# Patient Record
Sex: Female | Born: 1980 | Race: White | Hispanic: No | Marital: Single | Smoking: Never smoker
Health system: Southern US, Community
[De-identification: ages and names within clinical notes are randomized; demographics above are authoritative.]

## PROBLEM LIST (undated history)

## (undated) DIAGNOSIS — G4700 Insomnia, unspecified: Secondary | ICD-10-CM

## (undated) DIAGNOSIS — R519 Headache, unspecified: Secondary | ICD-10-CM

## (undated) DIAGNOSIS — R51 Headache: Secondary | ICD-10-CM

## (undated) DIAGNOSIS — J302 Other seasonal allergic rhinitis: Secondary | ICD-10-CM

## (undated) DIAGNOSIS — D649 Anemia, unspecified: Secondary | ICD-10-CM

## (undated) DIAGNOSIS — M2240 Chondromalacia patellae, unspecified knee: Secondary | ICD-10-CM

## (undated) HISTORY — DX: Insomnia, unspecified: G47.00

## (undated) HISTORY — DX: Headache: R51

## (undated) HISTORY — PX: BACK SURGERY: SHX140

## (undated) HISTORY — DX: Chondromalacia patellae, unspecified knee: M22.40

## (undated) HISTORY — DX: Headache, unspecified: R51.9

## (undated) HISTORY — DX: Anemia, unspecified: D64.9

---
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
## 2003-03-01 ENCOUNTER — Inpatient Hospital Stay (HOSPITAL_COMMUNITY): Admission: EM | Admit: 2003-03-01 | Discharge: 2003-03-05 | Payer: Self-pay | Admitting: Psychiatry

## 2005-12-03 ENCOUNTER — Other Ambulatory Visit: Admission: RE | Admit: 2005-12-03 | Discharge: 2005-12-03 | Payer: Self-pay | Admitting: Family Medicine

## 2006-02-03 ENCOUNTER — Emergency Department (HOSPITAL_COMMUNITY): Admission: EM | Admit: 2006-02-03 | Discharge: 2006-02-03 | Payer: Self-pay | Admitting: Family Medicine

## 2006-12-05 ENCOUNTER — Other Ambulatory Visit: Admission: RE | Admit: 2006-12-05 | Discharge: 2006-12-05 | Payer: Self-pay | Admitting: Family Medicine

## 2010-10-04 ENCOUNTER — Other Ambulatory Visit: Admission: RE | Admit: 2010-10-04 | Discharge: 2010-10-04 | Payer: Self-pay | Admitting: Family Medicine

## 2011-05-03 NOTE — Discharge Summary (Signed)
NAMEFERNANDA, Mcbride                            ACCOUNT NO.:  [id number]   MEDICAL RECORD NO.:  [id number]                   PATIENT TYPE:  IPS   LOCATION:  0507                                 FACILITY:  [facility]   PHYSICIAN:  Carolanne Grumbling, M.D.                 DATE OF BIRTH:  05-Nov-1981   DATE OF ADMISSION:  03/01/2003  DATE OF DISCHARGE:  03/05/2003                                 DISCHARGE SUMMARY   INTRODUCTION:  The patient was a 30 year old female.   INITIAL ASSESSMENT AND DIAGNOSIS:  The patient was admitted with a history  of depression.  She said things were catching up with her.  She found  herself feeling sad, a bit hopeless and helpless.  She was tearing up and  weepy and started having suicidal ideation.  She had no plan or intent but  she was just scared by the persistent suicidal thoughts.   MENTAL STATUS EXAM:  At the time of the initial evaluation revealed an  alert, tearful, young woman, who was appropriately dressed.  She appeared to  be depressed and affect was flat.  There was no evidence of any thought  disorder or other psychosis.  Short and long-term memory were intact.  Judgment seemed adequate.  Insight was minimal.   PHYSICAL EXAMINATION:  Essentially normal or noncontributory.   ADMISSION DIAGNOSES:   AXIS I:  Depressive disorder not otherwise specified.   AXIS II:  Deferred.   AXIS III:  Migraines by history.   AXIS IV:  Moderate.   AXIS V:  30/70.   LABORATORY DATA:  All indicated laboratory examinations were within normal  limits or noncontributory.   HOSPITAL COURSE:  While in the hospital, patient continued to talk about her  depression.  She basically was overworked and stressed out, she said.  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  She said she found  herself taking on her client's problems and bringing them home with her.  Consequently, even when she was home, she could not relax.  She also had the  pressures of  her school work and, overall, she said she just felt stressed  out.  By the time of discharge, she was denying any suicidal thoughts.  She  did say one other stress was that she considered herself an agnostic and, in  talking to friends, she has tended to lose friends.  She said she did not  want to be deceitful about what she was about and who she was but, at the  same time, she depended on her friends and did not know what to do with  that.  She also struggle with her mother to some extent.  Her father had  always been absent.  She was trying to get to know him.  He lives in  [place].  She said she could put that off  for a while because that was,  again, adding another stress to her life.  Because she consistently denied  any suicidal thoughts or suicidal intent, she was discharged home.   FINAL DIAGNOSES:   AXIS I:  Depressive disorder not otherwise specified.   AXIS II:  No diagnosis.   AXIS III:  Migraines by history.   AXIS IV:  Moderate.   AXIS V:  55/70.   DISCHARGE MEDICATIONS:  1. Adderall XR 10 mg daily.  2. Lexapro 10 mg daily.  3. Ambien 5 mg at bedtime as needed for sleep.   ACTIVITY/DIET:  There were no restrictions placed on her activity or her  diet.   FOLLOW UP:  She was to follow up at the [facility]  with an appointment for the 24th of March.                                               Carolanne Grumbling, M.D.    GT/MEDQ  D:  03/18/2003  T:  03/18/2003  Job:  [id number]

## 2011-05-03 NOTE — H&P (Signed)
[name], [name]                            ACCOUNT NO.:  [id number]   MEDICAL RECORD NO.:  [id number]                   PATIENT TYPE:  IPS   LOCATION:  0507                                 [facility]ACILITY[facility]  [facility]   PHYSICIAN:  Carolanne Grumbling, M.D.                 DATE OF BIRTH:  1981-09-26   DATE OF ADMISSION:  03/01/2003  DATE OF DISCHARGE:  03/05/2003                         PSYCHIATRIC ADMISSION ASSESSMENT   IDENTIFYING INFORMATION:  This is a 30 year old single white female  voluntarily admitted on March 01, 2003.   HISTORY OF PRESENT ILLNESS:  The patient presents with a history of  depression.  She states that things are just catching up with her and  found herself tearing up, feeling very weepy, having some suicidal  thoughts with no specific plans.  The patient is having difficulty feeling.  She is worrying about things such as her religious beliefs with other  relationships that she has.  She states that she is agnostic.  She reports  decreased sleep.  She states her appetite has been increasing.  She denies  any psychotic symptoms and promises safety on the unit.   PAST PSYCHIATRIC HISTORY:  First admission to [facility].  No  other psychiatric admissions.  No outpatient treatment.  The patient states  she did overdose at the age of 29 but was not hospitalized.   SOCIAL HISTORY:  This is a 30 year old single white female with no children.  She lives in a dorm at [employer].  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  Reports no legal problems.   FAMILY HISTORY:  The patient admits that there may be, but it is uncertain  as to diagnosis.   ALCOHOL/DRUG HISTORY:  She is a nonsmoker.  Drinks on occasion.  Feels it is  not a problem.  No drug use.   PRIMARY CARE PHYSICIAN[facility].   MEDICAL PROBLEMS:  Migraines, seasonal allergies, acid reflux, back  problems.   MEDICATIONS:  The patient takes over-the-counter  Motrin.   ALLERGIES:  No known allergies.   REVIEW OF SYSTEMS:  History of migraines, some muscle cramping and  environmental allergies and some chronic back pain.   PHYSICAL EXAMINATION:  VITAL SIGNS:  Temperature 99.2, heart rate 92,  respirations 28, blood pressure 128/76.  The patient is 5 feet 7 inches  tall.  She is 206 pounds.  She is a healthy-appearing but obese 30 year old  female in no acute distress.  Her respirations are easy.  Her skin color is  good.  Muscle strength and tone is equal bilaterally.  No neurological  findings on nonfocal.   LABORATORY DATA:  CBC is within normal limits.  CMET is within normal  limits.  Her urine drug screen is negative.  Her TSH is 1.097.   MENTAL STATUS EXAM:  She is an alert, young female.  Cooperative.  Casually  dressed.  Speech is clear and articulate.  Mood is depressed.  Affect is  flat.  Thought processes are coherent with no evidence of psychosis.  Does  not appear to be responding to any internal stimuli.  Cognitive function is  intact.  Memory is fair.  Judgment and insight are fair.  Appears to have  average to above-average intellect.   DIAGNOSES:   AXIS I:  Depression not otherwise specified.   AXIS II:  Deferred.   AXIS III:  1. Migraines.  2. Seasonal allergies.  3. Acid reflux.  4. Chronic back pain.   AXIS IV:  Other psychosocial problems.   AXIS V:  Current 30; this past year 65-70.   PLAN:  Voluntary admission for depression and suicidal ideation.  Contract  for safety.  Check every 15 minutes.  Will initiate an antidepressant to  decrease depressive symptoms.  The patient was agreeable to beginning  medication.  Medication-compliance was discussed to stabilize mood and  thinking so patient can be safe.  The patient is to follow up at [facility] with  health services and counseling services.   TENTATIVE LENGTH OF STAY:  Three to five days.      [name], N.P.                       Carolanne Grumbling,  M.D.    JO/MEDQ  D:  04/11/2003  T:  04/12/2003  Job:  [id number]

## 2011-05-20 ENCOUNTER — Other Ambulatory Visit: Payer: Self-pay | Admitting: Physician Assistant

## 2011-05-20 ENCOUNTER — Other Ambulatory Visit (HOSPITAL_COMMUNITY)
Admission: RE | Admit: 2011-05-20 | Discharge: 2011-05-20 | Disposition: A | Discharge status: Home / Self Care | Payer: 59 | Source: Ambulatory Visit | Attending: Family Medicine | Admitting: Family Medicine

## 2011-05-20 DIAGNOSIS — R87612 Low grade squamous intraepithelial lesion on cytologic smear of cervix (LGSIL): Secondary | ICD-10-CM | POA: Insufficient documentation

## 2011-05-21 ENCOUNTER — Encounter (HOSPITAL_BASED_OUTPATIENT_CLINIC_OR_DEPARTMENT_OTHER)
Admission: RE | Admit: 2011-05-21 | Discharge: 2011-05-21 | Disposition: A | Discharge status: Home / Self Care | Payer: 59 | Source: Ambulatory Visit | Attending: General Surgery | Admitting: General Surgery

## 2011-05-21 LAB — DIFFERENTIAL
Basophils Absolute: 0.1 10*3/uL (ref 0.0–0.1)
Lymphocytes Relative: 49 % — ABNORMAL HIGH (ref 12–46)
Lymphs Abs: 3.2 10*3/uL (ref 0.7–4.0)
Monocytes Absolute: 0.5 10*3/uL (ref 0.1–1.0)
Neutro Abs: 2.6 10*3/uL (ref 1.7–7.7)

## 2011-05-21 LAB — CBC
HCT: 35.7 % — ABNORMAL LOW (ref 36.0–46.0)
Hemoglobin: 11.8 g/dL — ABNORMAL LOW (ref 12.0–15.0)
MCHC: 33.1 g/dL (ref 30.0–36.0)
MCV: 85.6 fL (ref 78.0–100.0)
WBC: 6.6 10*3/uL (ref 4.0–10.5)

## 2011-05-23 ENCOUNTER — Ambulatory Visit (HOSPITAL_BASED_OUTPATIENT_CLINIC_OR_DEPARTMENT_OTHER)
Admission: RE | Admit: 2011-05-23 | Discharge: 2011-05-23 | Disposition: A | Discharge status: Home / Self Care | Payer: 59 | Source: Ambulatory Visit | Attending: General Surgery | Admitting: General Surgery

## 2011-05-23 ENCOUNTER — Other Ambulatory Visit: Payer: Self-pay | Admitting: General Surgery

## 2011-05-23 DIAGNOSIS — L7212 Trichodermal cyst: Secondary | ICD-10-CM | POA: Insufficient documentation

## 2011-05-23 DIAGNOSIS — K219 Gastro-esophageal reflux disease without esophagitis: Secondary | ICD-10-CM | POA: Insufficient documentation

## 2011-05-23 HISTORY — PX: CYSTECTOMY: SUR359

## 2011-05-30 ENCOUNTER — Encounter (INDEPENDENT_AMBULATORY_CARE_PROVIDER_SITE_OTHER): Payer: Self-pay | Admitting: General Surgery

## 2011-06-20 NOTE — Op Note (Signed)
  [name], [name]                ACCOUNT NO.:  [id number]  MEDICAL RECORD NO.:  [id number]  LOCATION:                                 FACILITY:  PHYSICIAN:  Mary Sella. Andrey Campanile, MD          DATE OF BIRTH:  DATE OF PROCEDURE:  05/23/2011 DATE OF DISCHARGE:                              OPERATIVE REPORT   PREOPERATIVE DIAGNOSIS:  Scalp pilar cyst x3.  POSTOPERATIVE DIAGNOSIS:  Scalp pilar cyst x3.  PROCEDURE:  Excision of scalp pilar cyst x3.  SURGEON:  Mary Sella. Andrey Campanile, MD  ASSISTANT SURGEON:  None.  ANESTHESIA:  Monitored anesthesia care plus 9 mL of 0.25% Marcaine with epi mixed with bicarb.  SPECIMEN:  Pilar cyst x3.  FINDINGS:  There are three approximately 1-cm epidermoid inclusion cyst on the scalp consistent with a pilar cyst mainly on the right side of the scalp.  ESTIMATED BLOOD LOSS:  Minimal.  INDICATIONS FOR PROCEDURE:  The patient is a very pleasant 30 year old Caucasian female who referred herself for several subcutaneous masses on her scalp.  After physical exam, they were consistent with pilar cyst. She had three that were particularly bothering her.  She desired surgical excision.  We discussed the risks and benefits of surgery including but not limited to bleeding, infection, possible recurrence, wound healing complications, and scarring.  She elected to proceed to surgery.  DESCRIPTION OF PROCEDURE:  After obtaining informed consent, the patient was brought back to the operating room, placed supine on the operating table.  Monitored anesthesia care was established.  She had been placed on the beanbag with the appropriate position and padding, and she was then placed in the left lateral decubitus position with her right side up.  She received Ancef prior to skin incision.  A surgical time-out was performed.  We used diluted ChloraPrep to prep the three areas on her scalp.  One was on the right, one was the top of her scalp, and one was more anterior on  the top of her crown.  They essentially were in the triangle.  The hair was sort of matted down and away.  I infiltrated local in the scalp in a regional fashion and then made a 1-cm overlying incision and then used a hemostat to circumferentially excise the cyst in its entirety.  The wound was irrigated.  I then placed two interrupted 4-0 Prolene sutures to reapproximate the skin.  The same procedure was done for the two other lesions.  A total of 9 mL of local was used.  The patient tolerated the procedure well.  There were no immediate complications. She was taken to the recovery room in stable condition.  There were no immediate complications.     Mary Sella. Andrey Campanile, MD     EMW/MEDQ  D:  05/23/2011  T:  05/23/2011  Job:  [id number]  Electronically Signed by Gaynelle Adu M.D. on 06/20/2011 02:00:56 PM

## 2012-08-03 ENCOUNTER — Other Ambulatory Visit: Payer: Self-pay | Admitting: Physician Assistant

## 2012-08-03 ENCOUNTER — Other Ambulatory Visit (HOSPITAL_COMMUNITY)
Admission: RE | Admit: 2012-08-03 | Discharge: 2012-08-03 | Disposition: A | Discharge status: Home / Self Care | Payer: 59 | Source: Ambulatory Visit | Attending: Family Medicine | Admitting: Family Medicine

## 2012-08-03 DIAGNOSIS — Z124 Encounter for screening for malignant neoplasm of cervix: Secondary | ICD-10-CM | POA: Insufficient documentation

## 2012-10-21 ENCOUNTER — Encounter (HOSPITAL_COMMUNITY): Payer: Self-pay | Admitting: Pharmacy Technician

## 2012-10-21 ENCOUNTER — Other Ambulatory Visit: Payer: Self-pay | Admitting: Neurological Surgery

## 2012-10-22 NOTE — Progress Notes (Signed)
Nurse called Dr. Verlee Rossetti office and informed staff that orders were under the signed and held tab and needed to be signed by Dr. Danielle Dess. Darl Pikes stated she would tell Dr. Danielle Dess.

## 2012-10-22 NOTE — Pre-Procedure Instructions (Signed)
20 [name]  10/22/2012   Your procedure is scheduled on:  Monday October 26, 2012.   Report to [facility] at 1215 PM.  Call this number if you have problems the morning of surgery: [phone]   Remember:   Do not eat food or drink:After Midnight.    Take these medicines the morning of surgery with A SIP OF WATER: Hydrocodone if needed for pain   Do not wear jewelry, make-up or nail polish.  Do not wear lotions, powders, or perfumes.   Do not shave 48 hours prior to surgery.   Do not bring valuables to the hospital.  Contacts, dentures or bridgework may not be worn into surgery.  Leave suitcase in the car. After surgery it may be brought to your room.  For patients admitted to the hospital, checkout time is 11:00 AM the day of discharge.   Patients discharged the day of surgery will not be allowed to drive home.  Name and phone number of your driver:   Special Instructions: Shower using CHG 2 nights before surgery and the night before surgery.  If you shower the day of surgery use CHG.  Use special wash - you have one bottle of CHG for all showers.  You should use approximately 1/3 of the bottle for each shower.   Please read over the following fact sheets that you were given: Pain Booklet, Coughing and Deep Breathing, MRSA Information and Surgical Site Infection Prevention

## 2012-10-23 ENCOUNTER — Encounter (HOSPITAL_COMMUNITY): Payer: Self-pay

## 2012-10-23 ENCOUNTER — Encounter (HOSPITAL_COMMUNITY)
Admission: RE | Admit: 2012-10-23 | Discharge: 2012-10-23 | Disposition: A | Discharge status: Home / Self Care | Payer: 59 | Source: Ambulatory Visit | Attending: Neurological Surgery | Admitting: Neurological Surgery

## 2012-10-23 HISTORY — DX: Other seasonal allergic rhinitis: J30.2

## 2012-10-23 LAB — CBC
Hemoglobin: 13.7 g/dL (ref 12.0–15.0)
MCH: 29.5 pg (ref 26.0–34.0)
MCHC: 34 g/dL (ref 30.0–36.0)
MCV: 86.9 fL (ref 78.0–100.0)

## 2012-10-23 LAB — SURGICAL PCR SCREEN: MRSA, PCR: NEGATIVE

## 2012-10-23 LAB — HCG, SERUM, QUALITATIVE: Preg, Serum: NEGATIVE

## 2012-10-23 NOTE — Progress Notes (Signed)
Patient denied having a cardiac or pulmonary history.

## 2012-10-25 MED ORDER — CEFAZOLIN SODIUM-DEXTROSE 2-3 GM-% IV SOLR
2.0000 g | INTRAVENOUS | Status: AC
  Administered 2012-10-26: 2 g via INTRAVENOUS
  Filled 2012-10-25: qty 50

## 2012-10-26 ENCOUNTER — Encounter (HOSPITAL_COMMUNITY): Payer: Self-pay | Admitting: *Deleted

## 2012-10-26 ENCOUNTER — Encounter (HOSPITAL_COMMUNITY): Payer: Self-pay | Admitting: Certified Registered"

## 2012-10-26 ENCOUNTER — Ambulatory Visit (HOSPITAL_COMMUNITY)
Admission: RE | Admit: 2012-10-26 | Discharge: 2012-10-27 | Disposition: A | Discharge status: Home / Self Care | Payer: 59 | Source: Ambulatory Visit | Attending: Neurological Surgery | Admitting: Neurological Surgery

## 2012-10-26 ENCOUNTER — Ambulatory Visit (HOSPITAL_COMMUNITY): Payer: 59

## 2012-10-26 ENCOUNTER — Encounter (HOSPITAL_COMMUNITY)
Admission: RE | Disposition: A | Discharge status: Home / Self Care | Payer: Self-pay | Source: Ambulatory Visit | Attending: Neurological Surgery

## 2012-10-26 ENCOUNTER — Ambulatory Visit (HOSPITAL_COMMUNITY): Payer: 59 | Admitting: Certified Registered"

## 2012-10-26 DIAGNOSIS — Z01812 Encounter for preprocedural laboratory examination: Secondary | ICD-10-CM | POA: Insufficient documentation

## 2012-10-26 DIAGNOSIS — M5127 Other intervertebral disc displacement, lumbosacral region: Secondary | ICD-10-CM

## 2012-10-26 DIAGNOSIS — M5126 Other intervertebral disc displacement, lumbar region: Secondary | ICD-10-CM | POA: Insufficient documentation

## 2012-10-26 HISTORY — PX: LUMBAR LAMINECTOMY/DECOMPRESSION MICRODISCECTOMY: SHX5026

## 2012-10-26 SURGERY — LUMBAR LAMINECTOMY/DECOMPRESSION MICRODISCECTOMY 1 LEVEL
Anesthesia: General | Site: Back | Laterality: Right | Wound class: Clean

## 2012-10-26 MED ORDER — QUETIAPINE FUMARATE 50 MG PO TABS
50.0000 mg | ORAL_TABLET | Freq: Every day | ORAL | Status: DC
  Administered 2012-10-26: 50 mg via ORAL
  Filled 2012-10-26 (×2): qty 1

## 2012-10-26 MED ORDER — PROPOFOL 10 MG/ML IV BOLUS
INTRAVENOUS | Status: DC | PRN
  Administered 2012-10-26: 200 mg via INTRAVENOUS

## 2012-10-26 MED ORDER — MIDAZOLAM HCL 5 MG/5ML IJ SOLN
INTRAMUSCULAR | Status: DC | PRN
  Administered 2012-10-26: 2 mg via INTRAVENOUS

## 2012-10-26 MED ORDER — HEMOSTATIC AGENTS (NO CHARGE) OPTIME
TOPICAL | Status: DC | PRN
  Administered 2012-10-26: 1 via TOPICAL

## 2012-10-26 MED ORDER — PROMETHAZINE HCL 25 MG/ML IJ SOLN: 6.2500 mg | INTRAMUSCULAR | Status: DC | PRN

## 2012-10-26 MED ORDER — LIDOCAINE HCL (CARDIAC) 20 MG/ML IV SOLN
INTRAVENOUS | Status: DC | PRN
  Administered 2012-10-26: 60 mg via INTRAVENOUS

## 2012-10-26 MED ORDER — HYDROMORPHONE HCL PF 1 MG/ML IJ SOLN
INTRAMUSCULAR | Status: AC
  Filled 2012-10-26: qty 1

## 2012-10-26 MED ORDER — 0.9 % SODIUM CHLORIDE (POUR BTL) OPTIME
TOPICAL | Status: DC | PRN
  Administered 2012-10-26: 1000 mL

## 2012-10-26 MED ORDER — LACTATED RINGERS IV SOLN
INTRAVENOUS | Status: DC | PRN
  Administered 2012-10-26 (×2): via INTRAVENOUS

## 2012-10-26 MED ORDER — MORPHINE SULFATE 2 MG/ML IJ SOLN
1.0000 mg | INTRAMUSCULAR | Status: DC | PRN
  Administered 2012-10-27: 2 mg via INTRAVENOUS
  Filled 2012-10-26: qty 1

## 2012-10-26 MED ORDER — ACETAMINOPHEN 650 MG RE SUPP: 650.0000 mg | RECTAL | Status: DC | PRN

## 2012-10-26 MED ORDER — ACETAMINOPHEN 325 MG PO TABS: 650.0000 mg | ORAL_TABLET | ORAL | Status: DC | PRN

## 2012-10-26 MED ORDER — HYDROCODONE-ACETAMINOPHEN 5-325 MG PO TABS: 1.0000 | ORAL_TABLET | ORAL | Status: DC | PRN

## 2012-10-26 MED ORDER — OXYCODONE HCL 5 MG PO TABS: 5.0000 mg | ORAL_TABLET | Freq: Once | ORAL | Status: DC | PRN

## 2012-10-26 MED ORDER — MENTHOL 3 MG MT LOZG: 1.0000 | LOZENGE | OROMUCOSAL | Status: DC | PRN

## 2012-10-26 MED ORDER — DIAZEPAM 5 MG PO TABS: 5.0000 mg | ORAL_TABLET | Freq: Four times a day (QID) | ORAL | Status: DC | PRN

## 2012-10-26 MED ORDER — LIDOCAINE-EPINEPHRINE 1 %-1:100000 IJ SOLN
INTRAMUSCULAR | Status: DC | PRN
  Administered 2012-10-26: 10 mL

## 2012-10-26 MED ORDER — VECURONIUM BROMIDE 10 MG IV SOLR
INTRAVENOUS | Status: DC | PRN
  Administered 2012-10-26: 8 mg via INTRAVENOUS

## 2012-10-26 MED ORDER — SODIUM CHLORIDE 0.9 % IJ SOLN: 3.0000 mL | INTRAMUSCULAR | Status: DC | PRN

## 2012-10-26 MED ORDER — MEPERIDINE HCL 25 MG/ML IJ SOLN: 6.2500 mg | INTRAMUSCULAR | Status: DC | PRN

## 2012-10-26 MED ORDER — OXYCODONE-ACETAMINOPHEN 5-325 MG PO TABS
1.0000 | ORAL_TABLET | ORAL | Status: DC | PRN
  Administered 2012-10-27: 2 via ORAL
  Filled 2012-10-26: qty 2

## 2012-10-26 MED ORDER — OXYCODONE HCL 5 MG/5ML PO SOLN
5.0000 mg | Freq: Once | ORAL | Status: DC | PRN
Start: 2012-10-26 — End: 2012-10-26

## 2012-10-26 MED ORDER — SODIUM CHLORIDE 0.9 % IJ SOLN: 3.0000 mL | Freq: Two times a day (BID) | INTRAMUSCULAR | Status: DC

## 2012-10-26 MED ORDER — THROMBIN 5000 UNITS EX SOLR
CUTANEOUS | Status: DC | PRN
  Administered 2012-10-26 (×2): 5000 [IU] via TOPICAL

## 2012-10-26 MED ORDER — BACITRACIN 50000 UNITS IM SOLR
INTRAMUSCULAR | Status: DC | PRN
  Administered 2012-10-26: 17:00:00

## 2012-10-26 MED ORDER — BUPIVACAINE HCL (PF) 0.5 % IJ SOLN
INTRAMUSCULAR | Status: DC | PRN
  Administered 2012-10-26 (×2): 10 mL

## 2012-10-26 MED ORDER — CEFAZOLIN SODIUM 1-5 GM-% IV SOLN
1.0000 g | Freq: Three times a day (TID) | INTRAVENOUS | Status: AC
  Administered 2012-10-27 (×2): 1 g via INTRAVENOUS
  Filled 2012-10-26 (×2): qty 50

## 2012-10-26 MED ORDER — KETOROLAC TROMETHAMINE 30 MG/ML IJ SOLN
INTRAMUSCULAR | Status: AC
  Administered 2012-10-26: 30 mg
  Filled 2012-10-26: qty 1

## 2012-10-26 MED ORDER — SODIUM CHLORIDE 0.9 % IV SOLN
INTRAVENOUS | Status: AC
  Filled 2012-10-26: qty 500

## 2012-10-26 MED ORDER — ONDANSETRON HCL 4 MG/2ML IJ SOLN
INTRAMUSCULAR | Status: AC
  Filled 2012-10-26: qty 2

## 2012-10-26 MED ORDER — KETOROLAC TROMETHAMINE 30 MG/ML IJ SOLN
30.0000 mg | Freq: Four times a day (QID) | INTRAMUSCULAR | Status: DC
  Administered 2012-10-27: 30 mg via INTRAVENOUS
  Filled 2012-10-26 (×4): qty 1

## 2012-10-26 MED ORDER — BACITRACIN 50000 UNITS IM SOLR
INTRAMUSCULAR | Status: AC
  Filled 2012-10-26: qty 1

## 2012-10-26 MED ORDER — SODIUM CHLORIDE 0.9 % IV SOLN
250.0000 mL | INTRAVENOUS | Status: DC
  Administered 2012-10-27: 250 mL via INTRAVENOUS

## 2012-10-26 MED ORDER — HYDROMORPHONE HCL PF 1 MG/ML IJ SOLN
0.2500 mg | INTRAMUSCULAR | Status: DC | PRN
  Administered 2012-10-26 (×4): 0.5 mg via INTRAVENOUS

## 2012-10-26 MED ORDER — SUFENTANIL CITRATE 50 MCG/ML IV SOLN
INTRAVENOUS | Status: DC | PRN
  Administered 2012-10-26: 10 ug via INTRAVENOUS
  Administered 2012-10-26: 20 ug via INTRAVENOUS
  Administered 2012-10-26: 10 ug via INTRAVENOUS

## 2012-10-26 MED ORDER — PHENOL 1.4 % MT LIQD: 1.0000 | OROMUCOSAL | Status: DC | PRN

## 2012-10-26 MED ORDER — ONDANSETRON HCL 4 MG/2ML IJ SOLN
4.0000 mg | INTRAMUSCULAR | Status: DC | PRN
  Administered 2012-10-26: 4 mg via INTRAVENOUS

## 2012-10-26 SURGICAL SUPPLY — 54 items
ADH SKN CLS APL DERMABOND .7 (GAUZE/BANDAGES/DRESSINGS) ×1
BAG DECANTER FOR FLEXI CONT (MISCELLANEOUS) ×2 IMPLANT
BLADE SURG ROTATE 9660 (MISCELLANEOUS) IMPLANT
BUR ACORN 6.0 (BURR) ×1 IMPLANT
BUR MATCHSTICK NEURO 3.0 LAGG (BURR) ×2 IMPLANT
CANISTER SUCTION 2500CC (MISCELLANEOUS) ×2 IMPLANT
CLOTH BEACON ORANGE TIMEOUT ST (SAFETY) ×2 IMPLANT
CONT SPEC 4OZ CLIKSEAL STRL BL (MISCELLANEOUS) ×2 IMPLANT
DECANTER SPIKE VIAL GLASS SM (MISCELLANEOUS) ×2 IMPLANT
DERMABOND ADVANCED (GAUZE/BANDAGES/DRESSINGS) ×1
DERMABOND ADVANCED .7 DNX12 (GAUZE/BANDAGES/DRESSINGS) ×1 IMPLANT
DRAPE MICROSCOPE LEICA (MISCELLANEOUS) ×1 IMPLANT
DRAPE PED LAPAROTOMY (DRAPES) ×2 IMPLANT
DRAPE POUCH INSTRU U-SHP 10X18 (DRAPES) ×2 IMPLANT
DRAPE PROXIMA HALF (DRAPES) ×1 IMPLANT
DURAPREP 26ML APPLICATOR (WOUND CARE) ×2 IMPLANT
ELECT REM PT RETURN 9FT ADLT (ELECTROSURGICAL) ×2
ELECTRODE REM PT RTRN 9FT ADLT (ELECTROSURGICAL) ×1 IMPLANT
GAUZE SPONGE 4X4 16PLY XRAY LF (GAUZE/BANDAGES/DRESSINGS) IMPLANT
GLOVE BIOGEL PI IND STRL 7.5 (GLOVE) IMPLANT
GLOVE BIOGEL PI IND STRL 8 (GLOVE) IMPLANT
GLOVE BIOGEL PI IND STRL 8.5 (GLOVE) ×1 IMPLANT
GLOVE BIOGEL PI INDICATOR 7.5 (GLOVE) ×1
GLOVE BIOGEL PI INDICATOR 8 (GLOVE) ×2
GLOVE BIOGEL PI INDICATOR 8.5 (GLOVE) ×1
GLOVE ECLIPSE 7.5 STRL STRAW (GLOVE) ×4 IMPLANT
GLOVE ECLIPSE 8.5 STRL (GLOVE) ×2 IMPLANT
GLOVE EXAM NITRILE LRG STRL (GLOVE) IMPLANT
GLOVE EXAM NITRILE MD LF STRL (GLOVE) IMPLANT
GLOVE EXAM NITRILE XL STR (GLOVE) IMPLANT
GLOVE EXAM NITRILE XS STR PU (GLOVE) IMPLANT
GOWN BRE IMP SLV AUR LG STRL (GOWN DISPOSABLE) ×1 IMPLANT
GOWN BRE IMP SLV AUR XL STRL (GOWN DISPOSABLE) ×1 IMPLANT
GOWN STRL REIN 2XL LVL4 (GOWN DISPOSABLE) ×3 IMPLANT
KIT BASIN OR (CUSTOM PROCEDURE TRAY) ×2 IMPLANT
KIT ROOM TURNOVER OR (KITS) ×2 IMPLANT
NDL SPNL 20GX3.5 QUINCKE YW (NEEDLE) IMPLANT
NEEDLE HYPO 22GX1.5 SAFETY (NEEDLE) ×2 IMPLANT
NEEDLE SPNL 20GX3.5 QUINCKE YW (NEEDLE) ×2 IMPLANT
NS IRRIG 1000ML POUR BTL (IV SOLUTION) ×2 IMPLANT
PACK LAMINECTOMY NEURO (CUSTOM PROCEDURE TRAY) ×2 IMPLANT
PAD ARMBOARD 7.5X6 YLW CONV (MISCELLANEOUS) ×6 IMPLANT
PATTIES SURGICAL .5 X1 (DISPOSABLE) ×1 IMPLANT
RUBBERBAND STERILE (MISCELLANEOUS) ×2 IMPLANT
SPONGE GAUZE 4X4 12PLY (GAUZE/BANDAGES/DRESSINGS) ×2 IMPLANT
SPONGE SURGIFOAM ABS GEL SZ50 (HEMOSTASIS) ×3 IMPLANT
SUT VIC AB 1 CT1 18XBRD ANBCTR (SUTURE) ×1 IMPLANT
SUT VIC AB 1 CT1 8-18 (SUTURE) ×2
SUT VIC AB 2-0 CP2 18 (SUTURE) ×2 IMPLANT
SUT VIC AB 3-0 SH 8-18 (SUTURE) ×2 IMPLANT
SYR 20ML ECCENTRIC (SYRINGE) ×2 IMPLANT
TOWEL OR 17X24 6PK STRL BLUE (TOWEL DISPOSABLE) ×2 IMPLANT
TOWEL OR 17X26 10 PK STRL BLUE (TOWEL DISPOSABLE) ×2 IMPLANT
WATER STERILE IRR 1000ML POUR (IV SOLUTION) ×2 IMPLANT

## 2012-10-26 NOTE — Anesthesia Preprocedure Evaluation (Addendum)
Anesthesia Evaluation  Patient identified by MRN, date of birth, ID band Patient awake    Reviewed: Allergy & Precautions, H&P , NPO status , Patient's Chart, lab work & pertinent test results  History of Anesthesia Complications Negative for: history of anesthetic complications  Airway Mallampati: I  Neck ROM: full    Dental No notable dental hx. (+) Teeth Intact and Dental Advidsory Given   Pulmonary neg pulmonary ROS,  breath sounds clear to auscultation  Pulmonary exam normal       Cardiovascular negative cardio ROS  IRhythm:regular Rate:Normal     Neuro/Psych  Headaches, negative neurological ROS  negative psych ROS   GI/Hepatic negative GI ROS, Neg liver ROS,   Endo/Other  negative endocrine ROS  Renal/GU negative Renal ROS  negative genitourinary   Musculoskeletal   Abdominal   Peds  Hematology negative hematology ROS (+)   Anesthesia Other Findings   Reproductive/Obstetrics negative OB ROS                           Anesthesia Physical Anesthesia Plan  ASA: I  Anesthesia Plan: General and General ETT   Post-op Pain Management:    Induction:   Airway Management Planned:   Additional Equipment:   Intra-op Plan:   Post-operative Plan:   Informed Consent: I have reviewed the patients History and Physical, chart, labs and discussed the procedure including the risks, benefits and alternatives for the proposed anesthesia with the patient or authorized representative who has indicated his/her understanding and acceptance.   Dental advisory given  Plan Discussed with: CRNA, Surgeon and Anesthesiologist  Anesthesia Plan Comments:        Anesthesia Quick Evaluation

## 2012-10-26 NOTE — Anesthesia Postprocedure Evaluation (Signed)
Anesthesia Post Note  Patient: Denisia Edgeman  Procedure(s) Performed: Procedure(s) (LRB): LUMBAR LAMINECTOMY/DECOMPRESSION MICRODISCECTOMY 1 LEVEL (Right)  Anesthesia type: general  Patient location: PACU  Post pain: Pain level controlled  Post assessment: Patient's Cardiovascular Status Stable  Last Vitals:  Filed Vitals:   10/26/12 1845  BP: 133/77  Pulse: 92  Temp:   Resp: 18    Post vital signs: Reviewed and stable  Level of consciousness: sedated  Complications: No apparent anesthesia complications

## 2012-10-26 NOTE — Preoperative (Signed)
Beta Blockers   Reason not to administer Beta Blockers:Not Applicable 

## 2012-10-26 NOTE — Transfer of Care (Signed)
Immediate Anesthesia Transfer of Care Note  Patient: [name]  Procedure(s) Performed: Procedure(s) (LRB) with comments: LUMBAR LAMINECTOMY/DECOMPRESSION MICRODISCECTOMY 1 LEVEL (Right) - Right Lumbar Five-Sacral One Microdiskectomy  Patient Location: PACU  Anesthesia Type:General  Level of Consciousness: awake, alert  and oriented  Airway & Oxygen Therapy: Patient Spontanous Breathing and Patient connected to nasal cannula oxygen  Post-op Assessment: Report given to PACU RN, Post -op Vital signs reviewed and stable and Patient moving all extremities  Post vital signs: Reviewed and stable  Complications: No apparent anesthesia complications

## 2012-10-26 NOTE — Progress Notes (Signed)
Dr. Danielle Dess visited at bedside

## 2012-10-26 NOTE — H&P (Signed)
CHIEF COMPLAINT:   Back and right buttock and leg pain.    HISTORY OF PRESENT ILLNESS:  [name] is a 31 year old, right-handed individual who tells me that she has had back pain for at least a couple of years' time.  She notes that she has had long standing problems with her back since she was 17 or 18 and has had treatment intermittently since that time.  She has had extensive courses of physical therapy and more recently she had been seen and treated by Drs. Naaman Plummer and Norlene Campbell.  She has had some trigger point injections in her back for the back pain.  She notes, however, that this past weekend she had acute and very abrupt change in the quality of the pain with severe radiation into the right lower extremity causing the entirety of the right leg to feel numb with more numbness on the outside of her toes and pain into the heel of the foot on that right side.  She also notes that her foot feels weak when she walks and she is uncertain that her leg won't collapse at times.  Because of the severity of the pain, she increased the amount of pain medication she has been using substantially and currently is using Hydrocodone and Acetaminophen 1 or 2 every 4 to 6 hours for the pain and she notes that she can't get out of the bed in the morning without having some pain medication before she can get up and get dressed.  She has been requiring help of her mom to help get her clothes on.  Furthermore, she notes that she cannot sit for any length of time comfortably.  She cannot stand for any length of time comfortably.  She cannot walk or be in any position for any length of time without some increase in back pain and that right leg pain increasing substantially.    Because of the acute change, she underwent an MRI of the lumbar spine yesterday.  This study demonstrates that she has a large extruded fragment of disc at the L5-S1 level on the right side clearly compromising the exit foreman for the S1 nerve  root inferiorly.  It also may be compromising the L5 nerve root in the foramen superiorly.  I demonstrated the findings on the MRI to her.  I note that she some moderate degenerative changes at the L5-S1 disc which is likely a chronic process, but the rest of her back actually appears quite healthy.  I indicated that this disc herniation is, indeed, on the large side of disc herniations as they go and is fairly focal on that right side.    PAST MEDICAL HISTORY:  Her general health has been good.  She suffers with insomnia which has been treated with some Seroquel 50 mg at bedtime and seems to help her difficulties.    [name]  #[id number]  DOB:  [birth year]    October 20, 2012  Page Two   FAMILY HISTORY:    Her mom has high blood pressure, high cholesterol and some cardiovascular disease and there is a family history of some heart disease and an uncle with diabetes.    PERSONAL HISTORY:   She had some wisdom teeth surgery as the only other surgical procedure that she had back in 1999.  She does not smoke.  She drinks alcohol socially.  She notes that she has some loss of height of about 2 inches since she has been a teenager and currently  is 5', 6" and 210 lbs.    DRUG ALLERGIES:   SHE NOTES AN INTOLERANCE TO IMITREX WHICH CAUSES DIZZINESS AND HEADACHE AND MOBIC WHICH CAUSES A RASH.    REVIEW OF SYSTEMS:   Systems review is notable for wearing of glasses, nasal congestion and drainage secondary to allergies, leg pain while walking of recent onset, leg weakness, back pain, arm pain secondary to old pinched nerve at C7 on the left side, difficulty with coordination in the arms and the right leg at the current time, insomnia and borderline anemia during most of her adult life.    PHYSICAL EXAMINATION:  She does stand straight and erect.  She walks with a mild antalgia involving that right lower extremity and demonstrates some gluteal weakness with a little bit of a steppage on that right leg.  Her  motor function appears diminished in the gastroc on the right side and she has difficulty standing and maintaining her balance onto her right foot.  She has an absent Achilles reflex on the right side.  Left-sided strength and reflexes are completely within the limits of normal.    IMPRESSION:    The patient has a large extruded fragment of disc at L5-S1 on the right side.  She has some weakness in the S1 distribution.  This appears to be an acute on chronic problem that she has had with her back for a number of years and she has been failing efforts at conservative management.  With the size of this disc herniation and the incipient weakness that it is causing, I believe that she would do well to have this area decompressed.  I discussed the surgical technique of a microdiskectomy at L5-S1 with her and her mother who accompanies her on this visit.  I indicated that the surgery would remove the mass of tissue that is putting pressure on the nerve root and we would also remove whatever loose and degenerated disc material is within the disc space.  I further indicated that what the surgery does not do is it does not render her a new disc.  She has a degenerated disc space and I explained the nature of the degenerative process to her.  Furthermore, the disc surgery does not fix the nerve.  It simply decompresses it so that the nerve can heal itself and hopefully return to normal function.  Generally, if this process has been fairly acute that happens in fairly rapid time after the surgery.  However, the nerve may sometimes take 6 to 12 weeks to heal itself fully and give relief of the pain from the irritation that it has endured.  Beyond this, I indicated that the major risks of the surgery include the possibility of spinal fluid leakage with the possibility of infection.  These are rare circumstances, but a spinal fluid leak could cause her to be in the hospital for a longer period of time and sometimes requires  repeated surgery.  The other risk that I was concerned with also is the potential for re-rupture of the disc.  I indicated that over her lifetime, she has about a 15% chance of re-rupturing this same disc.  On rare occasions this happens acutely after the surgery we will have a patient that is still recovering.  If that is the case, then re-operation is required.  However, I am hopeful that we can avoid any of these complications and give her good relief and a good return to a more normal functional state for her.  She is admitted for surgery today.

## 2012-10-26 NOTE — Op Note (Signed)
Preoperative diagnosis: Herniated nucleus pulposus L5-S1 right with right lumbar radiculopathy Postoperative diagnosis: Herniated nucleus pulposus L5-S1 right with right lumbar radiculopathy Procedure: Microdiscectomy L5-S1 right operating microscope microdissection technique Surgeon: Barnett Abu M.D. Assistant: Shirlean Kelly M.D. Indications: Patient is a 31 year old individual who's had severe back pain and right lower extremity pain for lobar a week's time acutely but she's had significant back pain with some proximal right lower extremity pain for 2 years time she is recently found to have a large extruded fragment of disc at L5-S1 and has been advised regarding surgical decompression.  Procedure: The patient was brought to the operating room supine on a stretcher. After the smooth induction of general endotracheal anesthesia patient was carefully turned to the prone position with the bony prominences being appropriately padded and protected. The lower lumbar spine was prepped with alcohol and DuraPrep and then draped in a sterile fashion. The needle was used to localize the area of L5-S1 and a radiograph was obtained demonstrating a needle at the level of L5. Skin was infiltrated with 10 cc of lidocaine 1-100,000 epinephrine mixed 50-50 with half percent Marcaine. An incision was made and carried down to the lumbar dorsal fascia the fascia was opened on the right side side of the midline and a subperiosteal dissection was performed in the interlaminar space of L5-S1. A self-retaining retractor was then placed into the wound. The yellow ligament was taken up from the interlaminar space at L5-S1 and the inferior margin of the lamina of L5 was removed and a partial facetectomy was performed at L5-S1. The common dural tube was explored and the take off of the S1 nerve root was identified and gradually mobilized. There was found to be significant fragment of disc under the common dural tube at the take off  of the S1 nerve root. This was gently dissected and removed. Her there exploration yielded an opening into the disc space  The disc space was evacuated of a significant quantity of moderately degenerated disc material. Dissection was carried out under the use of the operating microscope with the assistant providing traction and exposure and decompressing the lateral portion of the sac while I worked medially.  After adequate decompression was obtained hemostasis and the soft tissues was verified retractor was removed the operating microscope was removed from the field and the lumbar dorsal fascia was closed with #1 and 2-0 Vicryl in interrupted fashion. 3-0 Vicryl was used to close subcuticular skin. Blood loss was estimated at 20 cc.

## 2012-10-27 ENCOUNTER — Encounter (HOSPITAL_COMMUNITY): Payer: Self-pay | Admitting: Neurological Surgery

## 2012-10-27 MED ORDER — OXYCODONE-ACETAMINOPHEN 5-325 MG PO TABS: 1.0000 | ORAL_TABLET | ORAL | Status: DC | PRN

## 2012-10-27 MED ORDER — DIAZEPAM 5 MG PO TABS: 5.0000 mg | ORAL_TABLET | Freq: Four times a day (QID) | ORAL | Status: DC | PRN

## 2012-10-27 NOTE — Discharge Summary (Signed)
Physician Discharge Summary  Patient ID: [name] Needs MRN: [id number] DOB/AGE: 32-18-1982 32 y.o.  Admit date: 10/26/2012 Discharge date: 10/27/2012  Admission Diagnoses:Herniated Nucleus Pulposis L5 S1 Right  Discharge Diagnoses: Herniated Nucleus Pulposis L5 S1 Right Active Problems:  * No active hospital problems. *    Discharged Condition: good  Hospital Course: pt had surgery doing well leg pain gone.  Consults: None  Significant Diagnostic Studies: Mri lumbar  Treatments: surgery: microdiscectomy L5 S1 on the right.  Discharge Exam: Blood pressure 99/64, pulse 89, temperature 98.3 F (36.8 C), temperature source Oral, resp. rate 20, SpO2 98.00%. incision clean and dry. Motor function normal in lower extremities.  Disposition: 01-Home or Self Care  Discharge Orders    Future Orders Please Complete By Expires   Diet - low sodium heart healthy      Increase activity slowly      Discharge instructions      Comments:   Okay to shower. Do not apply salves or appointments to incision. No heavy lifting with the upper extremities greater than 15 pounds. May resume driving when not requiring pain medication and patient feels comfortable with doing so.   Call MD for:  redness, tenderness, or signs of infection (pain, swelling, redness, odor or green/yellow discharge around incision site)      Call MD for:  severe uncontrolled pain      Call MD for:  temperature >100.4          Medication List     As of 10/27/2012 10:59 AM    TAKE these medications         diazepam 5 MG tablet   Commonly known as: VALIUM   Take 1 tablet (5 mg total) by mouth every 6 (six) hours as needed (muscle spasm).      HYDROcodone-acetaminophen 5-325 MG per tablet   Commonly known as: NORCO/VICODIN   Take 1-2 tablets by mouth every 4 (four) hours as needed. For pain      ibuprofen 200 MG tablet   Commonly known as: ADVIL,MOTRIN   Take 400 mg by mouth every 6 (six) hours as needed. For pain        levonorgestrel 20 MCG/24HR IUD   Commonly known as: MIRENA   1 each by Intrauterine route once.      multivitamin with minerals Tabs   Take 1 tablet by mouth daily.      oxyCODONE-acetaminophen 5-325 MG per tablet   Commonly known as: PERCOCET/ROXICET   Take 1-2 tablets by mouth every 4 (four) hours as needed for pain.      QUEtiapine 50 MG tablet   Commonly known as: SEROQUEL   Take 50 mg by mouth at bedtime.         SignedStefani Dama 10/27/2012, 10:59 AM

## 2012-10-27 NOTE — Progress Notes (Signed)
Occupational Therapy Evaluation Patient Details Name: [name] MRN: [id number] DOB: Feb 28, 1981 Today's Date: 10/27/2012 Time: 4098-1191 OT Time Calculation (min): 18 min  OT Assessment / Plan / Recommendation Clinical Impression  31 yo s/p lumbar microdiscectomy. Completed all education regarding back precautions and ADL and functional mobility for ADL. Pt/mother verbalized understanding. Pt has all Naval architect. No further OT indicated at this time.    OT Assessment  Patient does not need any further OT services    Follow Up Recommendations  No OT follow up    Barriers to Discharge  none    Equipment Recommendations  None recommended by OT    Recommendations for Other Services  none  Frequency    none   Precautions / Restrictions Precautions Precautions: Back   Pertinent Vitals/Pain 3/10. Lower back.premedicated    ADL  Grooming: Supervision/safety Where Assessed - Grooming: Unsupported standing Upper Body Bathing: Supervision/safety;Set up Where Assessed - Upper Body Bathing: Unsupported sit to stand Lower Body Bathing: Minimal assistance Where Assessed - Lower Body Bathing: Unsupported sit to stand Upper Body Dressing: Set up;Supervision/safety Where Assessed - Upper Body Dressing: Unsupported sitting Lower Body Dressing: Minimal assistance Where Assessed - Lower Body Dressing: Unsupported sit to stand Toilet Transfer: Supervision/safety Toilet Transfer Method: Sit to Barista: Comfort height toilet Toileting - Clothing Manipulation and Hygiene: Modified independent Where Assessed - Engineer, mining and Hygiene: Sit to stand from 3-in-1 or toilet Equipment Used: Reacher;Cane Transfers/Ambulation Related to ADLs: supervison [place] ADL Comments: Educated pt on following back precautions and available AE to assist with LB dressing. Educated pt on use of reacher for [employer].Pt states she has bath seat. Educated pt on use of HH  shower to help adherre to back precautions when bathing and washing hair. Mother present for session.    OT Diagnosis:    OT Problem List:   OT Treatment Interventions:     OT Goals Acute Rehab OT Goals OT Goal Formulation:  (eval only)  Visit Information  Last OT Received On: 10/27/12    Subjective Data   My Mom will help me.   Prior Functioning     Home Living Lives With: Family Available Help at Discharge: Family Type of Home: House Bathroom Shower/Tub: Tub/shower unit;Walk-in shower Bathroom Toilet: Standard Bathroom Accessibility: Yes How Accessible: Accessible via walker Home Adaptive Equipment: Bedside commode/3-in-1;Reacher;Shower chair with back;Straight cane Additional Comments: mother will assist as needed Prior Function Level of Independence: Independent;Independent with assistive device(s) Able to Take Stairs?: Yes Driving: Yes Vocation: Full time employment Comments: used cane at times Communication Communication: No difficulties Dominant Hand: Right         Vision/Perception  WFL   Cognition  Overall Cognitive Status: Appears within functional limits for tasks assessed/performed Arousal/Alertness: Awake/alert Orientation Level: Appears intact for tasks assessed Behavior During Session: [facility] for tasks performed    Extremity/Trunk Assessment Right Upper Extremity Assessment RUE ROM/Strength/Tone: Within functional levels RUE Sensation: WFL - Light Touch;WFL - Proprioception RUE Coordination: WFL - gross/fine motor Left Upper Extremity Assessment LUE ROM/Strength/Tone: Within functional levels LUE Sensation: WFL - Light Touch;WFL - Proprioception LUE Coordination: WFL - gross/fine motor Trunk Assessment Trunk Assessment: Normal     Mobility Transfers Transfers: Sit to Stand;Stand to Sit Sit to Stand: 5: Supervision;With upper extremity assist Stand to Sit: 5: Supervision Details for Transfer Assistance: good technique                 Balance  WFL   End of  Session OT - End of Session Activity Tolerance: Patient tolerated treatment well Patient left: Other (comment) (walking with PT) Nurse Communication: Mobility status  GO   see doc flowsheeets for G codes  [name],[name] 21-Nov-2012, 10:20 AM [facility], OTR/L  [phone] November 21, 2012

## 2012-10-27 NOTE — Evaluation (Addendum)
Physical Therapy Evaluation Patient Details Name: [name] MRN: [id number] DOB: [birth year] Today's Date: 10/27/2012 Time: 1000-1012 PT Time Calculation (min): 12 min  PT Assessment / Plan / Recommendation Clinical Impression  Pt adm for microdiscectomy.  Pt recovering as expected.  Good home support.    PT Assessment  Patent does not need any further PT services    Follow Up Recommendations  No PT follow up    Does the patient have the potential to tolerate intense rehabilitation      Barriers to Discharge        Equipment Recommendations  None recommended by PT    Recommendations for Other Services     Frequency      Precautions / Restrictions Precautions Precautions: Back Precaution Comments: Gave back precaution handout.   Pertinent Vitals/Pain Surgical soreness.      Mobility  Transfers Sit to Stand: 5: Supervision;With upper extremity assist Stand to Sit: 5: Supervision Details for Transfer Assistance: good technique Ambulation/Gait Ambulation/Gait Assistance: 5: Supervision;4: Min assist Ambulation Distance (Feet): 1000 Feet Assistive device: Straight cane Ambulation/Gait Assistance Details: Pt initially using cane and mother's shoulder for support but then able to use cane only. Gait Pattern: Step-through pattern;Decreased stride length;Wide base of support Gait velocity: decr but improved with distance    Shoulder Instructions     Exercises     PT Diagnosis:    PT Problem List:   PT Treatment Interventions:     PT Goals    Visit Information  Last PT Received On: 10/27/12 Assistance Needed: +1    Subjective Data  Subjective: [name] Patient Stated Goal: return home   Prior Functioning  Home Living Lives With: Family Available Help at Discharge: Family Type of Home: House Home Access: Stairs to enter [employer] of Steps: 2 Entrance Stairs-Rails: None Bathroom  Shower/Tub: Tub/shower unit;Walk-in shower Bathroom Toilet: Standard Bathroom Accessibility: Yes How Accessible: Accessible via walker Home Adaptive Equipment: Bedside commode/3-in-1;Reacher;Shower chair with back;Straight cane Additional Comments: mother will assist as needed Prior Function Level of Independence: Independent;Independent with assistive device(s) Able to Take Stairs?: Yes Driving: Yes Vocation: Full time employment Comments: used cane at times Communication Communication: No difficulties Dominant Hand: Right    Cognition  Overall Cognitive Status: Appears within functional limits for tasks assessed/performed Arousal/Alertness: Awake/alert Orientation Level: Appears intact for tasks assessed Behavior During Session: [facility] for tasks performed    Extremity/Trunk Assessment Right Upper Extremity Assessment RUE ROM/Strength/Tone: Within functional levels RUE Sensation: WFL - Light Touch;WFL - Proprioception RUE Coordination: WFL - gross/fine motor Left Upper Extremity Assessment LUE ROM/Strength/Tone: Within functional levels LUE Sensation: WFL - Light Touch;WFL - Proprioception LUE Coordination: WFL - gross/fine motor Right Lower Extremity Assessment RLE ROM/Strength/Tone: WFL for tasks assessed RLE Sensation: Deficits RLE Sensation Deficits: numbness and tingling Left Lower Extremity Assessment LLE ROM/Strength/Tone: WFL for tasks assessed Trunk Assessment Trunk Assessment: Normal   Balance Static Standing Balance Static Standing - Balance Support: Right upper extremity supported Static Standing - Level of Assistance: 5: Stand by assistance  End of Session PT - End of Session Activity Tolerance: Patient tolerated treatment well Patient left: in bed;with call bell/phone within reach;with family/visitor present  GP Functional Assessment Tool Used: clinical judgement Functional Limitation: Mobility: Walking and moving around Mobility: Walking and Moving Around  Current Status (W0981): At least 1 percent but less than 20 percent impaired, limited or restricted Mobility: Walking and Moving Around Goal Status [phone]):  At least 1 percent but less than 20 percent impaired, limited or restricted Mobility: Walking and Moving Around Discharge Status [phone]): At least 1 percent but less than 20 percent impaired, limited or restricted   [facility] 10/27/2012, 11:13 AM  [facility] PT [phone]

## 2013-08-04 ENCOUNTER — Other Ambulatory Visit (HOSPITAL_COMMUNITY)
Admission: RE | Admit: 2013-08-04 | Discharge: 2013-08-04 | Disposition: A | Discharge status: Home / Self Care | Payer: 59 | Source: Ambulatory Visit | Attending: Family Medicine | Admitting: Family Medicine

## 2013-08-04 ENCOUNTER — Other Ambulatory Visit: Payer: Self-pay | Admitting: Physician Assistant

## 2013-08-04 DIAGNOSIS — Z124 Encounter for screening for malignant neoplasm of cervix: Secondary | ICD-10-CM | POA: Insufficient documentation

## 2013-12-02 ENCOUNTER — Other Ambulatory Visit (HOSPITAL_COMMUNITY): Payer: Self-pay | Admitting: Specialist

## 2013-12-02 ENCOUNTER — Encounter (HOSPITAL_COMMUNITY): Payer: Self-pay | Admitting: Pharmacy Technician

## 2013-12-06 ENCOUNTER — Encounter (HOSPITAL_COMMUNITY)
Admission: RE | Admit: 2013-12-06 | Discharge: 2013-12-06 | Disposition: A | Discharge status: Home / Self Care | Payer: 59 | Source: Ambulatory Visit | Attending: Specialist | Admitting: Specialist

## 2013-12-06 ENCOUNTER — Encounter (HOSPITAL_COMMUNITY): Payer: Self-pay

## 2013-12-06 DIAGNOSIS — Z01812 Encounter for preprocedural laboratory examination: Secondary | ICD-10-CM | POA: Insufficient documentation

## 2013-12-06 LAB — CBC
MCH: 29.7 pg (ref 26.0–34.0)
MCHC: 33.2 g/dL (ref 30.0–36.0)
MCV: 89.3 fL (ref 78.0–100.0)
Platelets: 319 10*3/uL (ref 150–400)
RDW: 13.2 % (ref 11.5–15.5)
WBC: 9.5 10*3/uL (ref 4.0–10.5)

## 2013-12-06 LAB — BASIC METABOLIC PANEL
Calcium: 8.8 mg/dL (ref 8.4–10.5)
Creatinine, Ser: 0.58 mg/dL (ref 0.50–1.10)
GFR calc Af Amer: >90 mL/min (ref >90)
GFR calc non Af Amer: >90 mL/min (ref >90)

## 2013-12-06 LAB — SURGICAL PCR SCREEN: MRSA, PCR: NEGATIVE

## 2013-12-06 NOTE — Pre-Procedure Instructions (Signed)
Elira Colasanti  12/06/2013   Your procedure is scheduled on:  12/14/13  Report to [facility]  2 * 3 at 530 AM.  Call this number if you have problems the morning of surgery: [phone]   Remember:   Do not eat food or drink liquids after midnight.   Take these medicines the morning of surgery with A SIP OF WATER: hydrocodone   Do not wear jewelry, make-up or nail polish.  Do not wear lotions, powders, or perfumes. You may wear deodorant.  Do not shave 48 hours prior to surgery. Men may shave face and neck.  Do not bring valuables to the hospital.  [facility] is not responsible                  for any belongings or valuables.               Contacts, dentures or bridgework may not be worn into surgery.  Leave suitcase in the car. After surgery it may be brought to your room.  For patients admitted to the hospital, discharge time is determined by your                treatment team.               Patients discharged the day of surgery will not be allowed to drive  home.  Name and phone number of your driver:   Special Instructions: Shower using CHG 2 nights before surgery and the night before surgery.  If you shower the day of surgery use CHG.  Use special wash - you have one bottle of CHG for all showers.  You should use approximately 1/3 of the bottle for each shower.   Please read over the following fact sheets that you were given: Pain Booklet, Coughing and Deep Breathing, MRSA Information and Surgical Site Infection Prevention

## 2013-12-13 MED ORDER — CEFAZOLIN SODIUM-DEXTROSE 2-3 GM-% IV SOLR
2.0000 g | INTRAVENOUS | Status: AC
  Administered 2013-12-14: 2 g via INTRAVENOUS

## 2013-12-13 NOTE — H&P (Signed)
[name] is an 32 y.o. female.   Chief Complaint: neck pain and left arm pain HPI: The pain comes and goes with different movements.  Still experiencing radiation into her shoulder left side, numbness and tingling into the fingertips of the left hand.  She is taking medicines of hydrocodone p.r.n. and some diclofenac.  We have had her seen in physical therapy and she notes that she has had a few episodes of therapy where she was very uncomfortable with some of the massage techniques that were used.  They seem to increase the discomfort some.  She has tried cervical traction.  She also is experiencing numbness and tingling into the radial 3 fingers of the left hand.  Difficulty with these, awakening her at night. ESI has not provided adequate relief of her symptoms.   She has noticed some persistent numbness, paresthesias in the left upper extremity into the thumb, index, long finger.  IMAGING:  Patient's plain radiographs are reviewed, AP and lateral radiograph of the cervical spine from 08/06/2013.  The patient's AP radiograph shows a straight cervical spine.  Disk spaces are fairly well maintained. The upper half of the cervical spine not seen well due to the patient's jaw and lower facial bones.  Lateral radiograph of the cervical spine shows soft tissue shadows anterior cervical spine appear normal in their width.  There is some straightening of the normal cervical lordotic curve through the midcervical segments, C4 through C6.  The disk space height minimally narrowed at C5-6.  There is no fracture, dislocation, or subluxation present.  C1-2 appears normal.  Patient has had an exam of her left wrist and hand also on the same date.  Those x-rays reviewed show no abnormality.   Patient's MRI scan is reviewed, the study done at [facility].  Shows a superimposed left posterolateral disk herniation at C5-6 with extension superiorly with elevation of the posterior longitudinal  ligaments.  Some mild impression upon the ventral nerve root and the left paracentral cord.  There is some minimal level II lymph nodes noted on the patient's study.  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    Results of her EMG/nerve conduction studies have returned indicating that there is no sign of carpal tunnel syndrome involving her left upper extremity.  Of note, no permanent neurologic changes noted in her EMGs/nerve conduction studies.    Past Medical History  Diagnosis Date  . Insomnia   . Chondromalacia patella   . Generalized headaches   . Anemia   . Seasonal allergies     Past Surgical History  Procedure Laterality Date  . Cystectomy  05/23/11    x3 on scalp  . 3rd molar removal  1999  . Lumbar laminectomy/decompression microdiscectomy  10/26/2012    Procedure: LUMBAR LAMINECTOMY/DECOMPRESSION MICRODISCECTOMY 1 LEVEL;  Surgeon: Barnett Abu, MD;  Location: [facility];  Service: Neurosurgery;  Laterality: Right;  Right Lumbar Five-Sacral One Microdiskectomy  . Back surgery      Family History  Problem Relation Age of Onset  . Heart disease Father   . Hyperlipidemia Father   . Hyperlipidemia Mother   . Hypertension Mother    Social History:  reports that she has never smoked. She does not have any smokeless tobacco history on file. She reports that she drinks alcohol. She reports that she does not use illicit drugs.  Allergies:  Allergies  Allergen Reactions  .  Imitrex [Sumatriptan] Rash and Other (See Comments)    Dizziness, Head ache   . Mobic [Meloxicam] Itching and Rash    Medications Prior to Admission  Medication Sig Dispense Refill  . HYDROcodone-acetaminophen (NORCO/VICODIN) 5-325 MG per tablet Take 1 tablet by mouth 2 (two) times daily as needed (for pain). For pain      . levonorgestrel (MIRENA) 20 MCG/24HR IUD 1 each by Intrauterine route once.      . Multiple Vitamin (MULTIVITAMIN  WITH MINERALS) TABS Take 1 tablet by mouth daily.      . QUEtiapine (SEROQUEL) 50 MG tablet Take 50 mg by mouth at bedtime.          No results found for this or any previous visit (from the past 48 hour(s)). No results found.  Review of Systems  Musculoskeletal: Positive for neck pain.  Neurological: Positive for sensory change and focal weakness.       Left UE  All other systems reviewed and are negative.    Blood pressure 109/56, pulse 75, temperature 97.4 F (36.3 C), temperature source Oral, resp. rate 20, SpO2 100.00%. Physical Exam  Constitutional: She is oriented to person, place, and time. She appears well-developed and well-nourished.  HENT:  Head: Normocephalic and atraumatic.  Eyes: Pupils are equal, round, and reactive to light.  Cardiovascular: Normal rate.   Respiratory: Effort normal.  GI: Soft.  Musculoskeletal:   limitations of lateral bending rotation of the neck to the left side, positive Spurling sign to the left.  Numbness, paresthesias in the left C6, C7 distribution.   Tinel sign of the left wrist is negative.  Phalen sign is positive of the left wrist.  Upper extremity motor showing some minimal weakness in the left triceps.  Finger extension strength is improved as is biceps strength.  Shoulder abduction strength is normal.  Wrist volar flexion strength appears intact and is improved on the left side.     Neurological: She is alert and oriented to person, place, and time.  Skin: Skin is warm and dry.     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

## 2013-12-14 ENCOUNTER — Ambulatory Visit (HOSPITAL_COMMUNITY): Payer: 59 | Admitting: Certified Registered Nurse Anesthetist

## 2013-12-14 ENCOUNTER — Inpatient Hospital Stay (HOSPITAL_COMMUNITY)
Admission: RE | Admit: 2013-12-14 | Discharge: 2013-12-15 | DRG: 473 | Disposition: A | Discharge status: Home / Self Care | Payer: 59 | Source: Ambulatory Visit | Attending: Specialist | Admitting: Specialist

## 2013-12-14 ENCOUNTER — Encounter (HOSPITAL_COMMUNITY): Payer: 59 | Admitting: Certified Registered Nurse Anesthetist

## 2013-12-14 ENCOUNTER — Ambulatory Visit (HOSPITAL_COMMUNITY): Payer: 59

## 2013-12-14 ENCOUNTER — Encounter (HOSPITAL_COMMUNITY)
Admission: RE | Disposition: A | Discharge status: Home / Self Care | Payer: Self-pay | Source: Ambulatory Visit | Attending: Specialist

## 2013-12-14 ENCOUNTER — Encounter (HOSPITAL_COMMUNITY): Payer: Self-pay | Admitting: Certified Registered Nurse Anesthetist

## 2013-12-14 DIAGNOSIS — Z79899 Other long term (current) drug therapy: Secondary | ICD-10-CM

## 2013-12-14 DIAGNOSIS — D649 Anemia, unspecified: Secondary | ICD-10-CM | POA: Diagnosis present

## 2013-12-14 DIAGNOSIS — M2240 Chondromalacia patellae, unspecified knee: Secondary | ICD-10-CM | POA: Diagnosis present

## 2013-12-14 DIAGNOSIS — Z8249 Family history of ischemic heart disease and other diseases of the circulatory system: Secondary | ICD-10-CM

## 2013-12-14 DIAGNOSIS — M5020 Other cervical disc displacement, unspecified cervical region: Principal | ICD-10-CM

## 2013-12-14 DIAGNOSIS — M5412 Radiculopathy, cervical region: Secondary | ICD-10-CM | POA: Diagnosis present

## 2013-12-14 DIAGNOSIS — Z889 Allergy status to unspecified drugs, medicaments and biological substances status: Secondary | ICD-10-CM

## 2013-12-14 DIAGNOSIS — G4700 Insomnia, unspecified: Secondary | ICD-10-CM | POA: Diagnosis present

## 2013-12-14 HISTORY — PX: ANTERIOR CERVICAL DECOMP/DISCECTOMY FUSION: SHX1161

## 2013-12-14 SURGERY — ANTERIOR CERVICAL DECOMPRESSION/DISCECTOMY FUSION 1 LEVEL
Anesthesia: General | Site: Spine Cervical

## 2013-12-14 MED ORDER — ROCURONIUM BROMIDE 100 MG/10ML IV SOLN
INTRAVENOUS | Status: DC | PRN
  Administered 2013-12-14: 50 mg via INTRAVENOUS

## 2013-12-14 MED ORDER — BUPIVACAINE-EPINEPHRINE (PF) 0.5% -1:200000 IJ SOLN
INTRAMUSCULAR | Status: AC
  Filled 2013-12-14: qty 10

## 2013-12-14 MED ORDER — CEFAZOLIN SODIUM 1-5 GM-% IV SOLN
1.0000 g | Freq: Once | INTRAVENOUS | Status: DC
  Filled 2013-12-14: qty 50

## 2013-12-14 MED ORDER — HYDROMORPHONE HCL PF 1 MG/ML IJ SOLN
INTRAMUSCULAR | Status: DC | PRN
  Administered 2013-12-14 (×2): 0.5 mg via INTRAVENOUS

## 2013-12-14 MED ORDER — ONDANSETRON HCL 4 MG/2ML IJ SOLN
4.0000 mg | INTRAMUSCULAR | Status: DC | PRN
  Administered 2013-12-14 (×2): 4 mg via INTRAVENOUS

## 2013-12-14 MED ORDER — THROMBIN 5000 UNITS EX SOLR
CUTANEOUS | Status: DC | PRN
  Administered 2013-12-14: 5000 [IU] via TOPICAL

## 2013-12-14 MED ORDER — HEMOSTATIC AGENTS (NO CHARGE) OPTIME
TOPICAL | Status: DC | PRN
  Administered 2013-12-14: 1 via TOPICAL

## 2013-12-14 MED ORDER — SODIUM CHLORIDE 0.9 % IV SOLN: 250.0000 mL | INTRAVENOUS | Status: DC

## 2013-12-14 MED ORDER — PANTOPRAZOLE SODIUM 40 MG IV SOLR
40.0000 mg | Freq: Every day | INTRAVENOUS | Status: DC
  Administered 2013-12-14: 40 mg via INTRAVENOUS
  Filled 2013-12-14 (×2): qty 40

## 2013-12-14 MED ORDER — PROMETHAZINE HCL 25 MG/ML IJ SOLN
6.2500 mg | INTRAMUSCULAR | Status: DC | PRN
  Administered 2013-12-14: 6.25 mg via INTRAVENOUS

## 2013-12-14 MED ORDER — SODIUM CHLORIDE 0.9 % IJ SOLN: 3.0000 mL | INTRAMUSCULAR | Status: DC | PRN

## 2013-12-14 MED ORDER — ONDANSETRON HCL 4 MG/2ML IJ SOLN
INTRAMUSCULAR | Status: AC
  Administered 2013-12-14: 2 mg
  Filled 2013-12-14: qty 2

## 2013-12-14 MED ORDER — ACETAMINOPHEN 650 MG RE SUPP: 650.0000 mg | RECTAL | Status: DC | PRN

## 2013-12-14 MED ORDER — HYDROCODONE-ACETAMINOPHEN 5-325 MG PO TABS: 1.0000 | ORAL_TABLET | ORAL | Status: DC | PRN

## 2013-12-14 MED ORDER — METHOCARBAMOL 500 MG PO TABS: 500.0000 mg | ORAL_TABLET | Freq: Four times a day (QID) | ORAL | Status: DC | PRN

## 2013-12-14 MED ORDER — MIDAZOLAM HCL 5 MG/5ML IJ SOLN
INTRAMUSCULAR | Status: DC | PRN
  Administered 2013-12-14: 2 mg via INTRAVENOUS

## 2013-12-14 MED ORDER — OXYCODONE-ACETAMINOPHEN 5-325 MG PO TABS
1.0000 | ORAL_TABLET | ORAL | Status: DC | PRN
  Administered 2013-12-14 – 2013-12-15 (×5): 2 via ORAL
  Filled 2013-12-14 (×5): qty 2

## 2013-12-14 MED ORDER — LACTATED RINGERS IV SOLN
INTRAVENOUS | Status: DC | PRN
  Administered 2013-12-14 (×2): via INTRAVENOUS

## 2013-12-14 MED ORDER — PHENOL 1.4 % MT LIQD: 1.0000 | OROMUCOSAL | Status: DC | PRN

## 2013-12-14 MED ORDER — KCL IN DEXTROSE-NACL 20-5-0.45 MEQ/L-%-% IV SOLN
INTRAVENOUS | Status: AC
  Filled 2013-12-14: qty 1000

## 2013-12-14 MED ORDER — QUETIAPINE FUMARATE 50 MG PO TABS
50.0000 mg | ORAL_TABLET | Freq: Every day | ORAL | Status: DC
  Administered 2013-12-14: 50 mg via ORAL
  Filled 2013-12-14 (×2): qty 1

## 2013-12-14 MED ORDER — OXYCODONE HCL 5 MG PO TABS
ORAL_TABLET | ORAL | Status: AC
  Filled 2013-12-14: qty 1

## 2013-12-14 MED ORDER — NEOSTIGMINE METHYLSULFATE 1 MG/ML IJ SOLN
INTRAMUSCULAR | Status: DC | PRN
  Administered 2013-12-14: 4 mg via INTRAVENOUS

## 2013-12-14 MED ORDER — DEXAMETHASONE SODIUM PHOSPHATE 10 MG/ML IJ SOLN
INTRAMUSCULAR | Status: DC | PRN
  Administered 2013-12-14: 10 mg via INTRAVENOUS

## 2013-12-14 MED ORDER — THROMBIN 5000 UNITS EX SOLR
CUTANEOUS | Status: AC
  Filled 2013-12-14: qty 5000

## 2013-12-14 MED ORDER — BISACODYL 10 MG RE SUPP: 10.0000 mg | Freq: Every day | RECTAL | Status: DC | PRN

## 2013-12-14 MED ORDER — FLEET ENEMA 7-19 GM/118ML RE ENEM: 1.0000 | ENEMA | Freq: Once | RECTAL | Status: AC | PRN

## 2013-12-14 MED ORDER — ONDANSETRON HCL 4 MG/2ML IJ SOLN
INTRAMUSCULAR | Status: AC
  Filled 2013-12-14: qty 2

## 2013-12-14 MED ORDER — 0.9 % SODIUM CHLORIDE (POUR BTL) OPTIME
TOPICAL | Status: DC | PRN
  Administered 2013-12-14: 1000 mL

## 2013-12-14 MED ORDER — HYDROMORPHONE HCL PF 1 MG/ML IJ SOLN
INTRAMUSCULAR | Status: AC
  Administered 2013-12-14: 0.5 mg via INTRAVENOUS
  Filled 2013-12-14: qty 1

## 2013-12-14 MED ORDER — LIDOCAINE HCL (CARDIAC) 20 MG/ML IV SOLN
INTRAVENOUS | Status: DC | PRN
  Administered 2013-12-14: 70 mg via INTRAVENOUS

## 2013-12-14 MED ORDER — PROMETHAZINE HCL 25 MG/ML IJ SOLN
INTRAMUSCULAR | Status: AC
  Administered 2013-12-14: 6.25 mg via INTRAVENOUS
  Filled 2013-12-14: qty 1

## 2013-12-14 MED ORDER — PROPOFOL 10 MG/ML IV BOLUS
INTRAVENOUS | Status: DC | PRN
  Administered 2013-12-14: 170 mg via INTRAVENOUS

## 2013-12-14 MED ORDER — MORPHINE SULFATE 2 MG/ML IJ SOLN: 1.0000 mg | INTRAMUSCULAR | Status: DC | PRN

## 2013-12-14 MED ORDER — FENTANYL CITRATE 0.05 MG/ML IJ SOLN
INTRAMUSCULAR | Status: DC | PRN
  Administered 2013-12-14: 50 ug via INTRAVENOUS
  Administered 2013-12-14 (×2): 75 ug via INTRAVENOUS
  Administered 2013-12-14: 50 ug via INTRAVENOUS

## 2013-12-14 MED ORDER — ACETAMINOPHEN 325 MG PO TABS: 650.0000 mg | ORAL_TABLET | ORAL | Status: DC | PRN

## 2013-12-14 MED ORDER — METHOCARBAMOL 100 MG/ML IJ SOLN
500.0000 mg | Freq: Four times a day (QID) | INTRAVENOUS | Status: DC | PRN
  Filled 2013-12-14: qty 5

## 2013-12-14 MED ORDER — ZOLPIDEM TARTRATE 5 MG PO TABS: 5.0000 mg | ORAL_TABLET | Freq: Every evening | ORAL | Status: DC | PRN

## 2013-12-14 MED ORDER — MENTHOL 3 MG MT LOZG: 1.0000 | LOZENGE | OROMUCOSAL | Status: DC | PRN

## 2013-12-14 MED ORDER — OXYCODONE HCL 5 MG PO TABS
5.0000 mg | ORAL_TABLET | Freq: Once | ORAL | Status: AC | PRN
  Administered 2013-12-14: 5 mg via ORAL

## 2013-12-14 MED ORDER — SODIUM CHLORIDE 0.9 % IJ SOLN: 3.0000 mL | Freq: Two times a day (BID) | INTRAMUSCULAR | Status: DC

## 2013-12-14 MED ORDER — BUPIVACAINE-EPINEPHRINE 0.5% -1:200000 IJ SOLN
INTRAMUSCULAR | Status: DC | PRN
  Administered 2013-12-14: 4 mL

## 2013-12-14 MED ORDER — GLYCOPYRROLATE 0.2 MG/ML IJ SOLN
INTRAMUSCULAR | Status: DC | PRN
  Administered 2013-12-14: .6 mg via INTRAVENOUS

## 2013-12-14 MED ORDER — ONDANSETRON HCL 4 MG/2ML IJ SOLN
INTRAMUSCULAR | Status: DC | PRN
  Administered 2013-12-14: 4 mg via INTRAVENOUS

## 2013-12-14 MED ORDER — OXYCODONE HCL 5 MG/5ML PO SOLN: 5.0000 mg | Freq: Once | ORAL | Status: AC | PRN

## 2013-12-14 MED ORDER — ARTIFICIAL TEARS OP OINT
TOPICAL_OINTMENT | OPHTHALMIC | Status: DC | PRN
  Administered 2013-12-14: 1 via OPHTHALMIC

## 2013-12-14 MED ORDER — HYDROMORPHONE HCL PF 1 MG/ML IJ SOLN
INTRAMUSCULAR | Status: AC
  Filled 2013-12-14: qty 1

## 2013-12-14 MED ORDER — MORPHINE SULFATE 4 MG/ML IJ SOLN
INTRAMUSCULAR | Status: AC
  Administered 2013-12-14: 4 mg
  Filled 2013-12-14: qty 1

## 2013-12-14 MED ORDER — KCL IN DEXTROSE-NACL 20-5-0.45 MEQ/L-%-% IV SOLN
INTRAVENOUS | Status: DC
  Administered 2013-12-14 – 2013-12-15 (×2): via INTRAVENOUS
  Filled 2013-12-14 (×4): qty 1000

## 2013-12-14 MED ORDER — DOCUSATE SODIUM 100 MG PO CAPS
100.0000 mg | ORAL_CAPSULE | Freq: Two times a day (BID) | ORAL | Status: DC
  Administered 2013-12-14 – 2013-12-15 (×2): 100 mg via ORAL
  Filled 2013-12-14 (×2): qty 1

## 2013-12-14 MED ORDER — HYDROMORPHONE HCL PF 1 MG/ML IJ SOLN
0.2500 mg | INTRAMUSCULAR | Status: DC | PRN
  Administered 2013-12-14 (×4): 0.5 mg via INTRAVENOUS

## 2013-12-14 SURGICAL SUPPLY — 67 items
ADH SKN CLS APL DERMABOND .7 (GAUZE/BANDAGES/DRESSINGS) ×1
BIT DRILL 14MM (INSTRUMENTS) IMPLANT
BLADE SURG ROTATE 9660 (MISCELLANEOUS) IMPLANT
BUR ROUND FLUTED 4 SOFT TCH (BURR) ×1 IMPLANT
BUR SABER RD CUTTING 3.0 (BURR) ×2 IMPLANT
CERV PARALLEL SM SALINE 7.0MM (Bone Implant) ×1 IMPLANT
CLOTH BEACON ORANGE TIMEOUT ST (SAFETY) ×1 IMPLANT
CORDS BIPOLAR (ELECTRODE) ×2 IMPLANT
COVER SURGICAL LIGHT HANDLE (MISCELLANEOUS) ×2 IMPLANT
DERMABOND ADVANCED (GAUZE/BANDAGES/DRESSINGS) ×1
DERMABOND ADVANCED .7 DNX12 (GAUZE/BANDAGES/DRESSINGS) ×1 IMPLANT
DRAIN TLS ROUND 10FR (DRAIN) IMPLANT
DRAPE C-ARM 42X72 X-RAY (DRAPES) ×2 IMPLANT
DRAPE MICROSCOPE LEICA (MISCELLANEOUS) ×2 IMPLANT
DRAPE POUCH INSTRU U-SHP 10X18 (DRAPES) ×2 IMPLANT
DRAPE SURG 17X23 STRL (DRAPES) ×6 IMPLANT
DRILL 14MM (INSTRUMENTS) ×2
DRSG MEPILEX BORDER 4X4 (GAUZE/BANDAGES/DRESSINGS) ×1 IMPLANT
DURAPREP 6ML APPLICATOR 50/CS (WOUND CARE) ×2 IMPLANT
ELECT BLADE 4.0 EZ CLEAN MEGAD (MISCELLANEOUS) ×2
ELECT COATED BLADE 2.86 ST (ELECTRODE) ×2 IMPLANT
ELECT REM PT RETURN 9FT ADLT (ELECTROSURGICAL) ×2
ELECTRODE BLDE 4.0 EZ CLN MEGD (MISCELLANEOUS) IMPLANT
ELECTRODE REM PT RTRN 9FT ADLT (ELECTROSURGICAL) ×1 IMPLANT
GLOVE BIO SURGEON STRL SZ8.5 (GLOVE) ×1 IMPLANT
GLOVE BIOGEL PI IND STRL 7.5 (GLOVE) ×1 IMPLANT
GLOVE BIOGEL PI INDICATOR 7.5 (GLOVE) ×1
GLOVE ECLIPSE 7.0 STRL STRAW (GLOVE) ×2 IMPLANT
GLOVE ECLIPSE 8.5 STRL (GLOVE) ×2 IMPLANT
GLOVE SURG 8.5 LATEX PF (GLOVE) ×2 IMPLANT
GLOVE SURG SS PI 8.5 STRL IVOR (GLOVE) ×1
GLOVE SURG SS PI 8.5 STRL STRW (GLOVE) IMPLANT
GOWN PREVENTION PLUS LG XLONG (DISPOSABLE) IMPLANT
GOWN PREVENTION PLUS XXLARGE (GOWN DISPOSABLE) ×2 IMPLANT
GOWN SRG XL XLNG 56XLVL 4 (GOWN DISPOSABLE) IMPLANT
GOWN STRL NON-REIN LRG LVL3 (GOWN DISPOSABLE) ×2 IMPLANT
GOWN STRL NON-REIN XL XLG LVL4 (GOWN DISPOSABLE) ×2
GOWN STRL REIN 2XL XLG LVL4 (GOWN DISPOSABLE) ×1 IMPLANT
HEAD HALTER (SOFTGOODS) ×2 IMPLANT
KIT BASIN OR (CUSTOM PROCEDURE TRAY) ×2 IMPLANT
KIT ROOM TURNOVER OR (KITS) ×2 IMPLANT
MANIFOLD NEPTUNE II (INSTRUMENTS) ×1 IMPLANT
NDL SPNL 20GX3.5 QUINCKE YW (NEEDLE) ×2 IMPLANT
NEEDLE SPNL 20GX3.5 QUINCKE YW (NEEDLE) ×4 IMPLANT
NS IRRIG 1000ML POUR BTL (IV SOLUTION) ×2 IMPLANT
PACK ORTHO CERVICAL (CUSTOM PROCEDURE TRAY) ×2 IMPLANT
PAD ARMBOARD 7.5X6 YLW CONV (MISCELLANEOUS) ×5 IMPLANT
PATTIES SURGICAL .5 X.5 (GAUZE/BANDAGES/DRESSINGS) IMPLANT
PIN DISTRACTION 14MM (PIN) ×4 IMPLANT
PLATE 14MM (Plate) ×1 IMPLANT
RESTRAINT LIMB HOLDER UNIV (RESTRAINTS) ×1 IMPLANT
SCREW 14MM (Screw) ×4 IMPLANT
SPONGE INTESTINAL PEANUT (DISPOSABLE) ×1 IMPLANT
SPONGE LAP 4X18 X RAY DECT (DISPOSABLE) ×1 IMPLANT
SPONGE SURGIFOAM ABS GEL 100 (HEMOSTASIS) ×1 IMPLANT
SUT ETHILON 2 0 FS 18 (SUTURE) ×1 IMPLANT
SUT ETHILON 4 0 PS 2 18 (SUTURE) ×1 IMPLANT
SUT VIC AB 2-0 CT1 27 (SUTURE)
SUT VIC AB 2-0 CT1 TAPERPNT 27 (SUTURE) ×1 IMPLANT
SUT VIC AB 3-0 FS2 27 (SUTURE) ×1 IMPLANT
SUT VICRYL 4-0 PS2 18IN ABS (SUTURE) ×2 IMPLANT
SYR 30ML LL (SYRINGE) ×1 IMPLANT
SYSTEM CHEST DRAIN TLS 7FR (DRAIN) ×1 IMPLANT
TOWEL OR 17X24 6PK STRL BLUE (TOWEL DISPOSABLE) ×2 IMPLANT
TOWEL OR 17X26 10 PK STRL BLUE (TOWEL DISPOSABLE) ×2 IMPLANT
TRAY FOLEY CATH 16FRSI W/METER (SET/KITS/TRAYS/PACK) IMPLANT
WATER STERILE IRR 1000ML POUR (IV SOLUTION) ×1 IMPLANT

## 2013-12-14 NOTE — Anesthesia Procedure Notes (Signed)
Procedure Name: Intubation Date/Time: 12/14/2013 7:50 AM Performed by: Angelica Pou Pre-anesthesia Checklist: Patient identified, Patient being monitored, Emergency Drugs available, Timeout performed and Suction available Patient Re-evaluated:Patient Re-evaluated prior to inductionOxygen Delivery Method: Circle system utilized Preoxygenation: Pre-oxygenation with 100% oxygen Intubation Type: IV induction Ventilation: Mask ventilation without difficulty Laryngoscope Size: Mac and 3 Grade View: Grade I Tube type: Oral Tube size: 7.5 mm Number of attempts: 1 Airway Equipment and Method: Stylet Placement Confirmation: ETT inserted through vocal cords under direct vision,  breath sounds checked- equal and bilateral and positive ETCO2 Secured at: 23 cm Tube secured with: Tape Dental Injury: Teeth and Oropharynx as per pre-operative assessment

## 2013-12-14 NOTE — Brief Op Note (Addendum)
12/14/2013  10:20 AM  PATIENT:  [name]  32 y.o. female  PRE-OPERATIVE DIAGNOSIS:  C5-6 herniated nucleus pulposus  POST-OPERATIVE DIAGNOSIS:   C5-6 herniated nucleus pulposus  PROCEDURE:  Procedure(s): ANTERIOR CERVICAL DISCECTOMY FUSION C5-6, plate, screws, allograft and local bone graft (N/A)  SURGEON:  Surgeon(s) and Role:    * Kerrin Champagne, MD - Primary  PHYSICIAN ASSISTANT: Maud Deed, PA-C  ANESTHESIA:   local and general  EBL:  Total I/O In: 1500 [I.V.:1500] Out: -   BLOOD ADMINISTERED:none  DRAINS: (one) TLS Drain(s) to suction in the anterior neck   LOCAL MEDICATIONS USED:  MARCAINE    and Amount: 10  ml  SPECIMEN:  No Specimen  DISPOSITION OF SPECIMEN:  N/A  COUNTS:  YES  TOURNIQUET:  * No tourniquets in log *  DICTATION: .Dragon Dictation  PLAN OF CARE: Admit for overnight observation  PATIENT DISPOSITION:  PACU - hemodynamically stable.   Delay start of Pharmacological VTE agent (>24hrs) due to surgical blood loss or risk of bleeding: yes

## 2013-12-14 NOTE — Interval H&P Note (Signed)
History and Physical Interval Note:  12/14/2013 7:30 AM  [name]  has presented today for surgery, with the diagnosis of C5-6 herniated nucleus pulposus  The various methods of treatment have been discussed with the patient and family. After consideration of risks, benefits and other options for treatment, the patient has consented to  Procedure(s): ANTERIOR CERVICAL DISCECTOMY FUSION C5-6, plate, screws, allograft and local bone graft (N/A) as a surgical intervention .  The patient's history has been reviewed, patient examined, no change in status, stable for surgery.  I have reviewed the patient's chart and labs.  Questions were answered to the patient's satisfaction.    Patient was seen and examined in the preop holding area. There has been no interval  Change in this patient's exam preop  history and physical exam  Lab tests and images have been examined and reviewed.  The Risks benefits and alternative treatments have been discussed  extensively,questions answered.  The patient has elected to undergo the discussed surgical treatment. [name]

## 2013-12-14 NOTE — Anesthesia Preprocedure Evaluation (Signed)
Anesthesia Evaluation  Patient identified by MRN, date of birth, ID band Patient awake    History of Anesthesia Complications Negative for: history of anesthetic complications  Airway Mallampati: II  Neck ROM: Full    Dental   Pulmonary neg pulmonary ROS,  breath sounds clear to auscultation        Cardiovascular Rhythm:Regular Rate:Normal     Neuro/Psych  Headaches,    GI/Hepatic   Endo/Other    Renal/GU      Musculoskeletal   Abdominal (+) + obese,   Peds  Hematology  (+) anemia ,   Anesthesia Other Findings   Reproductive/Obstetrics                           Anesthesia Physical Anesthesia Plan  ASA: II  Anesthesia Plan: General   Post-op Pain Management:    Induction: Intravenous  Airway Management Planned: Oral ETT  Additional Equipment:   Intra-op Plan:   Post-operative Plan: Extubation in OR  Informed Consent: I have reviewed the patients History and Physical, chart, labs and discussed the procedure including the risks, benefits and alternatives for the proposed anesthesia with the patient or authorized representative who has indicated his/her understanding and acceptance.   Dental advisory given  Plan Discussed with: CRNA and Surgeon  Anesthesia Plan Comments:         Anesthesia Quick Evaluation

## 2013-12-14 NOTE — Transfer of Care (Signed)
Immediate Anesthesia Transfer of Care Note  Patient: [name]  Procedure(s) Performed: Procedure(s): ANTERIOR CERVICAL DISCECTOMY FUSION C5-6, plate, screws, allograft and local bone graft (N/A)  Patient Location: PACU  Anesthesia Type:General  Level of Consciousness: awake, alert , oriented and patient cooperative  Airway & Oxygen Therapy: Patient Spontanous Breathing and Patient connected to nasal cannula oxygen  Post-op Assessment: Report given to PACU RN, Post -op Vital signs reviewed and stable and Patient moving all extremities X 4  Post vital signs: Reviewed and stable  Complications: No apparent anesthesia complications

## 2013-12-14 NOTE — Brief Op Note (Deleted)
12/14/2013  3:47 PM  PATIENT:  [name]  32 y.o. female  PRE-OPERATIVE DIAGNOSIS:  C5-6 herniated nucleus pulposus  POST-OPERATIVE DIAGNOSIS:  C5-6 herniated nucleus pulposus  PROCEDURE:  Procedure(s): ANTERIOR CERVICAL DISCECTOMY FUSION C5-6, plate, screws, allograft and local bone graft (N/A)  SURGEON:  Surgeon(s) and Role:    * Kerrin Champagne, MD - Primary  PHYSICIAN ASSISTANT:   ANESTHESIA:   General, supplemented with local marcaine 1/4% with 1/200,000 epi 7cc, Dr. Jacklynn Bue.  EBL:  Total I/O In: 1500 [I.V.:1500] Out: -    BLOOD ADMINISTERED:none  DRAINS: (one) TLS Drain(s) to suction in the anterior neck   LOCAL MEDICATIONS USED:  MARCAINE    and Amount: 7 ml  SPECIMEN:  No Specimen  DISPOSITION OF SPECIMEN:  N/A  COUNTS:  YES  TOURNIQUET:  * No tourniquets in log *  DICTATION: .Dragon Dictation  PLAN OF CARE: Admit for overnight observation  PATIENT DISPOSITION:  PACU - hemodynamically stable.   Delay start of Pharmacological VTE agent (>24hrs) due to surgical blood loss or risk of bleeding: yes

## 2013-12-14 NOTE — Preoperative (Signed)
Beta Blockers   Reason not to administer Beta Blockers:Not Applicable 

## 2013-12-14 NOTE — Progress Notes (Signed)
Orthopedic Tech Progress Note Patient Details:  [name] 10/04/1981 [id number]  Ortho Devices Type of Ortho Device: Soft collar Ortho Device/Splint Interventions: Casandra Doffing 12/14/2013, 9:41 PM

## 2013-12-14 NOTE — Op Note (Addendum)
12/14/2013  7:48 AM  PATIENT:  [name]  32 y.o. female  MRN: [id number]  OPERATIVE REPORT  PRE-OPERATIVE DIAGNOSIS:  C5-6 herniated nucleus pulposus  POST-OPERATIVE DIAGNOSIS:  C5-6 herniated nucleus pulposus     PROCEDURE:  Procedure(s): ANTERIOR CERVICAL DISCECTOMY FUSION C5-6, plate, screws, allograft and local bone graft    SURGEON:  Kerrin Champagne, MD     ASSISTANT:  Maud Deed, PA-C  (Present throughout the entire procedure and necessary for completion of procedure in a timely manner)     ANESTHESIA:  General, Dr. Jacklynn Bue.    COMPLICATIONS:  None.     COMPONENTS: Alphatec 14mm plate and 14mm screws x 4.   PROCEDURE:The patient was met in the holding area, and the appropriate  left C5.6 cervical level identified and marked with an "x" and my initials. The patient was then transported to OR and was placed on the operative table in a supine position head supported on the well padded Mayfield horseshoe. The patient was then placed under  general anesthesia without difficulty intubated atraumaticly.      Cervical spine was positioned with a Mayfield horseshoe and 5 pound cervical halter traction. All pressure points well padded and semi-beach chair position. Arm holder both arms. Standard prep with DuraPrep solution the anterior cervical spine chest. Draped in the usual manner. Iodine vi drape was used. Standard timeout protocol was carried out identifying the patient procedure side of the procedure and level. The skin the left neck was infiltrated with Marcaine with epinephrine total of 7 cc. This at the level of expected C5-6 incision and also along a skin crease in line with the patients lines of Langer. Incision transverse at the C6 level and carried down to the level of the platysma. Then was carried down to the anterior aspect of the sternocleidomastoid muscle. The omohyoid muscle identified and divided. The interval between the trachea and esophagus medially and the  carotid sheath laterally was developed with a Metzenbaum scissors and blunt dissection exposing the anterior aspect of cervical spine at the C6 level.  The prevertebral fascia anterior to the cervical was cauterized with bipolar and teased across the midline with a Barista. An 18-gauge spinal needle was placed with sheath intact allowing only a centimeter to extend into the C5-6 disc and observed on lateral radiogragh  at the C5-6 level. Handheld Cloward retraction of the soft tissues while identifying the level at C5-6 and also while removing a portion of the anterior aspect of the disc with15 blade scalpel and pituitary. Medial border of the longus collie muscles was carefully elevated bilaterally and self-retaining retractors were introduced the foot of the blade beneath the medial border of the longus colli muscles. Soft tissue overlying the anterior borders of the disc space at C5-6 level carefully debridement of soft tissue back to bony edges. The anterior lip osteophytes were then resected using rongeur. This bone was preserved for later bone grafting purposes. The disc space was then first prepared using loupe magnification and headlight with resection of degenerative disc annulus anteriorly and posteriorly and cartilaginous endplates using micro-curettes pituitaries and a high-speed bur. The operating room microscope was draped sterilely and brought into the field. Under the operating room microscope and posterior aspect of the disc was excised using micro-curettes pituitary rongeur and times per. Posterior lip osteophytes were drilled using a high-speed bur and a carefully resected with 1 and 2 mm Kerrison foraminotomy performed over both C6 nerve roots using 1 and 2 mm Kerrisons  disc herniation material was noted centrally and into the left foramen some of which represented reflected portion of the patient's annulus into the neuroforamen. Following decompression of the spinal cord and both C6  nerve roots, irrigation was carried out. The endplates of the inferior aspect of C5 and superior aspect of C6 were carefully prepared using a high-speed bur to parallel. The disc space was then sounded utilized and a precontoured sounder for the transgraft implant. Surrounding was carried up to a 7mm implant.  7 mm transgraft spacer was felt to provide best fit for the C5-6 disc space. The transgraft permanent lordotic allograft implant was then brought onto the field. It was then packed with local bone graft that had been harvested previously. The implant was then carefully placed over the intervertebral disc space at C5-6 level. Care taken to ensure that no bone or soft tissue debris within the disc space that could be retropulsed with insertion of the cage and bone graft. The implant was then impacted into place with the head placed in longitudinal cervical traction.  The implant was felt to be in excellent position alignment. Cervical distraction instrumentation was removed and the screw post holes coated with wax for hemostasis. Length of the plate was then chosen using bone wax applied to a cottonoid string and measuring from the edge of the lower cervical vertebral border of C5 the upper cervical border of C6  A  14 millimeter plate was chosen. 14 mm screws were then placed into the C5  locking the plate in place. Additional 14 mm screws were then placed in the C6 level  cervical traction had been released prior to screw placement. Intraoperative lateral radiograph demonstrated the plates and screws in good position alignment no sign of impingement upon the cervical canal. The graft appeared in good position alignment.  Upper extremity longitudinal traction using wrist restraints was necessary to obtain visualization of the lower cervical level.  This point irrigation was carried out at cervical incision site.The esophagus examined at the cervical level  and found to be normal. Irrigation was again carried  out there was no active bleeding present. The incisions were then closed by approximating the deep subcutaneous layers the platysma layer with interrupted 3-0 Vicryl suture and the superficial fascia overlying the sternocleidomastoid muscle with interrupted 3-0 Vicryl sutures. The subcutaneous layers were approximated with interrupted 3-0 Vicryl sutures as were the superficial layers. The skin was closed with a running subcutaneous stitch of 4-0 Vicryl . Skin was approximated with Dermabond. Mepilex bandage was applied. A Philadelphia collar was then applied to the cervical spine released on the cervical spine and drapes were removed. Physician assistant's responsibilities: Maud Deed PA-C perform the duties of assistant physician and surgeon during this case present from the beginning of the case to the end of the case. She assisted with careful retraction of neural structures suctioning about her elements including cervical cord and C6 nerve root. Performed closure of the incision on the ligamentum nuchae to the skin and application of dressing. She assisted in positioning the patient had removal the patient from the OR table to her stretcher.     Tiffaney Heimann E 12/14/2013, 7:48 AM

## 2013-12-15 MED ORDER — METHOCARBAMOL 500 MG PO TABS: 500.0000 mg | ORAL_TABLET | Freq: Four times a day (QID) | ORAL | Status: AC | PRN

## 2013-12-15 MED ORDER — OXYCODONE-ACETAMINOPHEN 5-325 MG PO TABS: 1.0000 | ORAL_TABLET | ORAL | Status: AC | PRN

## 2013-12-15 NOTE — Progress Notes (Addendum)
Patient ID: [name], female   DOB: [birth year], 32 y.o.   MRN: [id number] Subjective: 1 Day Post-Op Procedure(s) (LRB): ANTERIOR CERVICAL DISCECTOMY FUSION C5-6, plate, screws, allograft and local bone graft (N/A) Awake, alert and oriented x 4. Mod discomfort, po narcotics are of help, so persisting left shoulder discomfort. Tolerating po liquids. When can I leave this place. Patient reports pain as moderate.    Objective:   VITALS:  Temp:  [97.1 F (36.2 C)-98.3 F (36.8 C)] 98.3 F (36.8 C) (12/31 0541) Pulse Rate:  [57-95] 79 (12/31 0541) Resp:  [8-24] 18 (12/31 0541) BP: (101-127)/(51-76) 105/56 mmHg (12/31 0541) SpO2:  [96 %-100 %] 96 % (12/31 0541)  Neurologically intact ABD soft Neurovascular intact Sensation intact distally Intact pulses distally Dorsiflexion/Plantar flexion intact Incision: no drainage No cellulitis present Dressing changed, incision dry, Drain TLS discontinued.   LABS No results found for this basename: HGB, WBC, PLT,  in the last 72 hours No results found for this basename: NA, K, CL, CO2, BUN, CREATININE, GLUCOSE,  in the last 72 hours No results found for this basename: LABPT, INR,  in the last 72 hours   Assessment/Plan: 1 Day Post-Op Procedure(s) (LRB): ANTERIOR CERVICAL DISCECTOMY FUSION C5-6, plate, screws, allograft and local bone graft (N/A)  Advance diet Up with therapy Discharge home Change to soft C collar.  Syliva Mee E 12/15/2013, 8:30 AM

## 2013-12-15 NOTE — Discharge Summary (Signed)
Physician Discharge Summary  Patient ID: [name] MRN: [id number] DOB/AGE: Jun 08, 1981 32 y.o.  Admit date: 12/14/2013 Discharge date: 12/15/2013  Admission Diagnoses:  Principal Problem:   HNP (herniated nucleus pulposus), cervical Active Problems:   Cervical radiculitis   Discharge Diagnoses:  Same  Past Medical History  Diagnosis Date  . Insomnia   . Chondromalacia patella   . Generalized headaches   . Anemia   . Seasonal allergies     Surgeries: Procedure(s): ANTERIOR CERVICAL DISCECTOMY FUSION C5-6, plate, screws, allograft and local bone graft on 12/14/2013   Consultants:    Discharged Condition: Improved  Hospital Course: [name] is an 32 y.o. female who was admitted 12/14/2013 with a chief complaint of No chief complaint on file. , and found to have a diagnosis of HNP (herniated nucleus pulposus), cervical.  They were brought to the operating room on 12/14/2013 and underwent the above named procedures.    They were given perioperative antibiotics:  Anti-infectives   Start     Dose/Rate Route Frequency Ordered Stop   12/14/13 1700  ceFAZolin (ANCEF) IVPB 1 g/50 mL premix  Status:  Discontinued     1 g 100 mL/hr over 30 Minutes Intravenous  Once 12/14/13 1249 12/15/13 1737   12/14/13 0600  ceFAZolin (ANCEF) IVPB 2 g/50 mL premix     2 g 100 mL/hr over 30 Minutes Intravenous On call to O.R. 12/13/13 1410 12/14/13 0755    .  They were given sequential compression devices, early ambulation, and chemoprophylaxis for DVT prophylaxis. In the PACU alert and oriented, numbness and paresthesias into left UE improved, still some mild left shoulder Discomfort. Changed to soft C collar on POD#1. Voiding without difficulty, ambulating to the bathroom with minimal Assistance. TLS drain discontinued on POD#1. She was tolerating po narcotics and po liquids. Incision was dry without erythrema or drainage. No swelling. Her UEs were neurovascular normal. She was  discharged home on POD#1, They benefited maximally from their hospital stay and there were no complications.    Recent vital signs:  Filed Vitals:   12/15/13 1349  BP: 114/60  Pulse: 80  Temp: 97.6 F (36.4 C)  Resp: 15    Recent laboratory studies:  Results for orders placed during the hospital encounter of 12/06/13  SURGICAL PCR SCREEN      Result Value Range   MRSA, PCR NEGATIVE  NEGATIVE   Staphylococcus aureus NEGATIVE  NEGATIVE  CBC      Result Value Range   WBC 9.5  4.0 - 10.5 K/uL   RBC 4.28  3.87 - 5.11 MIL/uL   Hemoglobin 12.7  12.0 - 15.0 g/dL   HCT 78.2  95.6 - 21.3 %   MCV 89.3  78.0 - 100.0 fL   MCH 29.7  26.0 - 34.0 pg   MCHC 33.2  30.0 - 36.0 g/dL   RDW 08.6  57.8 - 46.9 %   Platelets 319  150 - 400 K/uL  BASIC METABOLIC PANEL      Result Value Range   Sodium 138  135 - 145 mEq/L   Potassium 4.0  3.5 - 5.1 mEq/L   Chloride 103  96 - 112 mEq/L   CO2 27  19 - 32 mEq/L   Glucose, Bld 82  70 - 99 mg/dL   BUN 9  6 - 23 mg/dL   Creatinine, Ser 6.29  0.50 - 1.10 mg/dL   Calcium 8.8  8.4 - 52.8 mg/dL   GFR calc non Af Amer >  90  >90 mL/min   GFR calc Af Amer >90  >90 mL/min    Discharge Medications:     Medication List    STOP taking these medications       HYDROcodone-acetaminophen 5-325 MG per tablet  Commonly known as:  NORCO/VICODIN      TAKE these medications       levonorgestrel 20 MCG/24HR IUD  Commonly known as:  MIRENA  1 each by Intrauterine route once.     methocarbamol 500 MG tablet  Commonly known as:  ROBAXIN  Take 1 tablet (500 mg total) by mouth every 6 (six) hours as needed for muscle spasms.     multivitamin with minerals Tabs tablet  Take 1 tablet by mouth daily.     oxyCODONE-acetaminophen 5-325 MG per tablet  Commonly known as:  PERCOCET/ROXICET  Take 1-2 tablets by mouth every 4 (four) hours as needed for moderate pain.     QUEtiapine 50 MG tablet  Commonly known as:  SEROQUEL  Take 50 mg by mouth at bedtime.         Diagnostic Studies: Dg Cervical Spine 1 View  12/14/2013   CLINICAL DATA:  C5-6 ACDF  EXAM: DG CERVICAL SPINE - 1 VIEW  COMPARISON:  MRI 08/17/2013  FINDINGS: Single lateral view shows anterior cervical discectomy and fusion at C5-6. Interbody fusion material is well position. Anterior plate and screws are present.  IMPRESSION: ACDF C5-6.   Electronically Signed   By: Paulina Fusi M.D.   On: 12/14/2013 11:10    Disposition: 01-Home or Self Care      Discharge Orders   Future Orders Complete By Expires   Call MD / Call 911  As directed    Comments:     If you experience chest pain or shortness of breath, CALL 911 and be transported to the hospital emergency room.  If you develope a fever above 101 F, pus (white drainage) or increased drainage or redness at the wound, or calf pain, call your surgeon's office.   Constipation Prevention  As directed    Comments:     Drink plenty of fluids.  Prune juice may be helpful.  You may use a stool softener, such as Colace (over the counter) 100 mg twice a day.  Use MiraLax (over the counter) for constipation as needed.   Diet - low sodium heart healthy  As directed    Discharge instructions  As directed    Comments:     No lifting greater than 10 lbs. No overhead use of arms. Avoid bending,and twisting neck. Walk in house for first week them may start to get out slowly increasing distance up to one mile by 3 weeks post op. Keep incision dry for 3 days, may then bathe and wet incision using a Philadelphia collar when showering. Call if any fevers >101, chills, or increasing numbness or weakness or increased swelling or drainage.   Driving restrictions  As directed    Comments:     No driving for 3 weeks   Increase activity slowly as tolerated  As directed    Lifting restrictions  As directed    Comments:     No lifting for 6 weeks      Follow-up Information   Follow up with Dezirea Mccollister E, MD In 2 weeks.   Specialty:  Orthopedic Surgery    Contact information:   [address] [place] [address] [phone]        Signed: Vira Browns  E 12/15/2013, 5:53 PM

## 2013-12-15 NOTE — Anesthesia Postprocedure Evaluation (Signed)
  Anesthesia Post-op Note  Patient: [name]  Procedure(s) Performed: Procedure(s): ANTERIOR CERVICAL DISCECTOMY FUSION C5-6, plate, screws, allograft and local bone graft (N/A)  Patient Location: PACU  Anesthesia Type:General  Level of Consciousness: awake, alert  and oriented  Airway and Oxygen Therapy: Patient Spontanous Breathing and Patient connected to nasal cannula oxygen  Post-op Pain: mild  Post-op Assessment: Post-op Vital signs reviewed, Patient's Cardiovascular Status Stable, Respiratory Function Stable, Patent Airway, No signs of Nausea or vomiting, Adequate PO intake and Pain level controlled  Post-op Vital Signs: Reviewed and stable  Complications: No apparent anesthesia complications

## 2013-12-15 NOTE — Progress Notes (Signed)
Utilization review completed.  

## 2013-12-15 NOTE — Progress Notes (Signed)
Cyndi Bender to be D/C'd Home per MD order. Discussed with the patient and all questions fully answered.    Medication List    STOP taking these medications       HYDROcodone-acetaminophen 5-325 MG per tablet  Commonly known as:  NORCO/VICODIN      TAKE these medications       levonorgestrel 20 MCG/24HR IUD  Commonly known as:  MIRENA  1 each by Intrauterine route once.     methocarbamol 500 MG tablet  Commonly known as:  ROBAXIN  Take 1 tablet (500 mg total) by mouth every 6 (six) hours as needed for muscle spasms.     multivitamin with minerals Tabs tablet  Take 1 tablet by mouth daily.     oxyCODONE-acetaminophen 5-325 MG per tablet  Commonly known as:  PERCOCET/ROXICET  Take 1-2 tablets by mouth every 4 (four) hours as needed for moderate pain.     QUEtiapine 50 MG tablet  Commonly known as:  SEROQUEL  Take 50 mg by mouth at bedtime.        VVS, Skin clean, dry and intact without evidence of skin break down, no evidence of skin tears noted.  IV catheter discontinued intact. Site without signs and symptoms of complications. Dressing and pressure applied.  An After Visit Summary was printed and given to the patient.  Patient escorted via WC, and D/C home via private auto.  Kai Levins  12/15/2013 3:13 PM

## 2013-12-16 NOTE — Anesthesia Postprocedure Evaluation (Signed)
  Anesthesia Post-op Note  Patient: [name]  Procedure(s) Performed: Procedure(s): ANTERIOR CERVICAL DISCECTOMY FUSION C5-6, plate, screws, allograft and local bone graft (N/A)  Patient Location: PACU  Anesthesia Type:General  Level of Consciousness: awake  Airway and Oxygen Therapy: Patient Spontanous Breathing  Post-op Pain: mild  Post-op Assessment: Post-op Vital signs reviewed  Post-op Vital Signs: stable  Complications: No apparent anesthesia complications

## 2013-12-20 ENCOUNTER — Encounter (HOSPITAL_COMMUNITY): Payer: Self-pay | Admitting: Specialist

## 2014-04-13 ENCOUNTER — Ambulatory Visit (INDEPENDENT_AMBULATORY_CARE_PROVIDER_SITE_OTHER): Payer: 59 | Admitting: Neurology

## 2014-04-13 ENCOUNTER — Encounter: Payer: Self-pay | Admitting: Neurology

## 2014-04-13 ENCOUNTER — Encounter (INDEPENDENT_AMBULATORY_CARE_PROVIDER_SITE_OTHER): Payer: Self-pay

## 2014-04-13 VITALS — BP 100/64 | HR 70 | Ht 66.0 in | Wt 199.0 lb

## 2014-04-13 DIAGNOSIS — M5412 Radiculopathy, cervical region: Secondary | ICD-10-CM

## 2014-04-13 DIAGNOSIS — M5020 Other cervical disc displacement, unspecified cervical region: Secondary | ICD-10-CM

## 2014-04-13 MED ORDER — DULOXETINE HCL 60 MG PO CPEP: 60.0000 mg | ORAL_CAPSULE | Freq: Every day | ORAL | Status: AC

## 2014-04-13 NOTE — Progress Notes (Signed)
PATIENT: [name] DOB: 1981-09-22  HISTORICAL  [name] is a 33 years old right-handed Caucasian female, referred by her orthopedic surgeon Dr. Otelia SergeantNitka for evaluation of continued left-sided symptoms  She presented with left hand numbness tingling, later also developed mild left hand weakness, symptoms moved rostrally to involving her left arm, then her left shoulder, later noticed left neck shooting pain.  She underwent cervical disectomy and fusion of C5 and 6, plate, screws, allograft and local bone graft by Dr. Otelia SergeantNitka in December 14 2013, postsurgically, she had less left-sided neck pain,left hand weakness has improved as well, but she has intermittent left shoulder deep achy hollow pain, also intermittent left arm paresthesia, mainly involving left lateral 3 fingers,  She also had a history of lumbar laminectomy, decompression, microdisectomy of right L5-S1, by Dr. Danielle DessElsner in 10/2012.  She continues to have intermittent shooting pain from her right leg, to her right lower extremity, involving right 3 lateral toes.  She has been taking hydrocodone 3-4 times each week, as needed for her left arm symptoms, tried low-dose Neurontin without helping,  EMG nerve conduction study by Dr. Alvester MorinNewton in April 1st 2015 of left upper extremity was normal, there was no evidence of left cervical radiculopathy, or left upper extremity neuropathy, such as left carpal tunnel syndrome, or left ulnar neuropathy,  REVIEW OF SYSTEMS: Full 14 system review of systems performed and notable only for fatigue, ringing in ears, spinning sensation,joint pain, allergy, headache, numbness, weakness, insomnia,  ALLERGIES: Allergies  Allergen Reactions  . Imitrex [Sumatriptan] Rash and Other (See Comments)    Dizziness, Head ache   . Mobic [Meloxicam] Itching and Rash    HOME MEDICATIONS: Current Outpatient Prescriptions on File Prior to Visit  Medication Sig Dispense Refill  . levonorgestrel (MIRENA) 20  MCG/24HR IUD 1 each by Intrauterine route once.      . methocarbamol (ROBAXIN) 500 MG tablet Take 1 tablet (500 mg total) by mouth every 6 (six) hours as needed for muscle spasms.  40 tablet  1  . Multiple Vitamin (MULTIVITAMIN WITH MINERALS) TABS Take 1 tablet by mouth daily.      Marland Kitchen. oxyCODONE-acetaminophen (PERCOCET/ROXICET) 5-325 MG per tablet Take 1-2 tablets by mouth every 4 (four) hours as needed for moderate pain.  50 tablet  0  . QUEtiapine (SEROQUEL) 50 MG tablet Take 50 mg by mouth at bedtime.           PAST MEDICAL HISTORY: Past Medical History  Diagnosis Date  . Insomnia   . Chondromalacia patella   . Generalized headaches   . Anemia   . Seasonal allergies     PAST SURGICAL HISTORY: Past Surgical History  Procedure Laterality Date  . Cystectomy  05/23/11    x3 on scalp  . 3rd molar removal  1999  . Lumbar laminectomy/decompression microdiscectomy  10/26/2012    Procedure: LUMBAR LAMINECTOMY/DECOMPRESSION MICRODISCECTOMY 1 LEVEL;  Surgeon: Barnett AbuHenry Elsner, MD;  Location: [facility];  Service: Neurosurgery;  Laterality: Right;  Right Lumbar Five-Sacral One Microdiskectomy  . Back surgery    . Anterior cervical decomp/discectomy fusion N/A 12/14/2013    Procedure: ANTERIOR CERVICAL DISCECTOMY FUSION C5-6, plate, screws, allograft and local bone graft;  Surgeon: Kerrin ChampagneJames E Nitka, MD;  Location: [facility];  Service: Orthopedics;  Laterality: N/A;    FAMILY HISTORY: Family History  Problem Relation Age of Onset  . Heart disease Father   . Hyperlipidemia Father   . Hyperlipidemia Mother   . Hypertension Mother  SOCIAL HISTORY:  History   Social History  . Marital Status: Single    Spouse Name: N/A    Number of Children: 0  . Years of Education: college   Occupational History  . Desk job [employer]   Social History Main Topics  . Smoking status: Never Smoker   . Smokeless tobacco: Never Used  . Alcohol Use: Yes     Comment: socially  . Drug Use: No  .  Sexual Activity: Yes    Birth Control/ Protection: IUD   Other Topics Concern  . Not on file   Social History Narrative   Patient lives at home alone and she is single.   Education college   Patient works for [employer] full time.   Both  Handed.     PHYSICAL EXAM   Filed Vitals:   04/13/14 0842  BP: 100/64  Pulse: 70  Height: 5\' 6"  (1.676 m)  Weight: 199 lb (90.266 kg)    Not recorded    Body mass index is 32.13 kg/(m^2).   Generalized: In no acute distress  Neck: Supple, no carotid bruits   Cardiac: Regular rate rhythm  Pulmonary: Clear to auscultation bilaterally  Musculoskeletal: No deformity  Neurological examination  Mentation: Alert oriented to time, place, history taking, and causual conversation  Cranial nerve II-XII: Pupils were equal round reactive to light. Extraocular movements were full.  Visual field were full on confrontational test. Bilateral fundi were sharp.  Facial sensation and strength were normal. Hearing was intact to finger rubbing bilaterally. Uvula tongue midline.  Head turning and shoulder shrug and were normal and symmetric.Tongue protrusion into cheek strength was normal.  Motor: Normal tone, bulk and strength.  Sensory: Intact to fine touch, pinprick, preserved vibratory sensation, and proprioception at toes.  Coordination: Normal finger to nose, heel-to-shin bilaterally there was no truncal ataxia  Gait: Rising up from seated position without assistance, normal stance, without trunk ataxia, moderate stride, good arm swing, smooth turning, able to perform tiptoe, and heel walking without difficulty.   Romberg signs: Negative  Deep tendon reflexes: Brachioradialis 2/2, biceps 2/2, triceps 2/2, patellar 2/2, Achilles 2/2, plantar responses were flexor bilaterally.   DIAGNOSTIC DATA (LABS, IMAGING, TESTING) - I reviewed patient records, labs, notes, testing and imaging myself where available.  Lab Results  Component Value  Date   WBC 9.5 12/06/2013   HGB 12.7 12/06/2013   HCT 38.2 12/06/2013   MCV 89.3 12/06/2013   PLT 319 12/06/2013      Component Value Date/Time   NA 138 12/06/2013 1525   K 4.0 12/06/2013 1525   CL 103 12/06/2013 1525   CO2 27 12/06/2013 1525   GLUCOSE 82 12/06/2013 1525   BUN 9 12/06/2013 1525   CREATININE 0.58 12/06/2013 1525   CALCIUM 8.8 12/06/2013 1525   GFRNONAA >90 12/06/2013 1525   GFRAA >90 12/06/2013 1525   ASSESSMENT AND PLAN  [name] is a 33 y.o. female complains of left-sided neck pain, radiating pain to her left shoulder,  Left arm, essentially normal neurological examination, normally electrodiagnostic study,  1, will try Cymbalta 60 mg every day, for symptomatic control,  2. Return to clinic in 6 months with Eber Jonesarolyn, if she remains symptomatic then, may consider repeat MRI of the cervical spine, or even repeating EMG nerve conduction study    Levert FeinsteinYijun Heloise Gordan, M.D. Ph.D.  [facility] [address], [address] [place], [place] [address] [phone]

## 2014-08-08 ENCOUNTER — Other Ambulatory Visit: Payer: Self-pay | Admitting: Physician Assistant

## 2014-08-08 ENCOUNTER — Other Ambulatory Visit (HOSPITAL_COMMUNITY)
Admission: RE | Admit: 2014-08-08 | Discharge: 2014-08-08 | Disposition: A | Discharge status: Home / Self Care | Payer: 59 | Source: Ambulatory Visit | Attending: Family Medicine | Admitting: Family Medicine

## 2014-08-08 DIAGNOSIS — Z124 Encounter for screening for malignant neoplasm of cervix: Secondary | ICD-10-CM | POA: Insufficient documentation

## 2014-08-10 LAB — CYTOLOGY - PAP

## 2014-10-13 ENCOUNTER — Ambulatory Visit: Payer: Self-pay | Admitting: Adult Health

## 2014-10-17 ENCOUNTER — Encounter: Payer: Self-pay | Admitting: *Deleted

## 2015-05-03 ENCOUNTER — Other Ambulatory Visit: Payer: Self-pay | Admitting: Physical Medicine and Rehabilitation

## 2015-05-03 ENCOUNTER — Ambulatory Visit
Admission: RE | Admit: 2015-05-03 | Discharge: 2015-05-03 | Disposition: A | Discharge status: Home / Self Care | Payer: 59 | Source: Ambulatory Visit | Attending: Physical Medicine and Rehabilitation | Admitting: Physical Medicine and Rehabilitation

## 2015-05-03 DIAGNOSIS — M542 Cervicalgia: Secondary | ICD-10-CM

## 2015-05-03 DIAGNOSIS — M545 Low back pain: Secondary | ICD-10-CM

## 2015-05-20 ENCOUNTER — Other Ambulatory Visit: Payer: Self-pay

## 2015-08-10 ENCOUNTER — Other Ambulatory Visit (HOSPITAL_COMMUNITY)
Admission: RE | Admit: 2015-08-10 | Discharge: 2015-08-10 | Disposition: A | Discharge status: Home / Self Care | Payer: BLUE CROSS/BLUE SHIELD | Source: Ambulatory Visit | Attending: Family Medicine | Admitting: Family Medicine

## 2015-08-10 ENCOUNTER — Other Ambulatory Visit: Payer: Self-pay | Admitting: Physician Assistant

## 2015-08-10 DIAGNOSIS — Z124 Encounter for screening for malignant neoplasm of cervix: Secondary | ICD-10-CM | POA: Diagnosis present

## 2015-08-11 LAB — CYTOLOGY - PAP

## 2016-04-04 ENCOUNTER — Other Ambulatory Visit: Payer: Self-pay | Admitting: Physician Assistant

## 2016-04-04 ENCOUNTER — Other Ambulatory Visit (HOSPITAL_COMMUNITY)
Admission: RE | Admit: 2016-04-04 | Discharge: 2016-04-04 | Disposition: A | Discharge status: Home / Self Care | Payer: 59 | Source: Ambulatory Visit | Attending: Family Medicine | Admitting: Family Medicine

## 2016-04-04 DIAGNOSIS — Z124 Encounter for screening for malignant neoplasm of cervix: Secondary | ICD-10-CM | POA: Insufficient documentation

## 2016-04-08 LAB — CYTOLOGY - PAP

## 2016-06-20 ENCOUNTER — Other Ambulatory Visit: Payer: Self-pay | Admitting: Obstetrics and Gynecology

## 2016-06-20 ENCOUNTER — Other Ambulatory Visit (HOSPITAL_COMMUNITY)
Admission: RE | Admit: 2016-06-20 | Discharge: 2016-06-20 | Disposition: A | Discharge status: Home / Self Care | Payer: BLUE CROSS/BLUE SHIELD | Source: Ambulatory Visit | Attending: Obstetrics and Gynecology | Admitting: Obstetrics and Gynecology

## 2016-06-20 DIAGNOSIS — Z1151 Encounter for screening for human papillomavirus (HPV): Secondary | ICD-10-CM | POA: Diagnosis not present

## 2016-06-24 LAB — CERVICOVAGINAL ANCILLARY ONLY: HPV (WINDOPATH): NOT DETECTED

## 2016-07-12 ENCOUNTER — Other Ambulatory Visit: Payer: Self-pay | Admitting: General Surgery

## 2018-05-27 DIAGNOSIS — Z8249 Family history of ischemic heart disease and other diseases of the circulatory system: Secondary | ICD-10-CM | POA: Diagnosis not present

## 2018-05-27 DIAGNOSIS — F339 Major depressive disorder, recurrent, unspecified: Secondary | ICD-10-CM | POA: Diagnosis not present

## 2018-05-27 DIAGNOSIS — Z13220 Encounter for screening for lipoid disorders: Secondary | ICD-10-CM | POA: Diagnosis not present

## 2018-05-27 DIAGNOSIS — Z0000 Encounter for general adult medical examination without abnormal findings: Secondary | ICD-10-CM | POA: Diagnosis not present

## 2019-06-07 DIAGNOSIS — Z0000 Encounter for general adult medical examination without abnormal findings: Secondary | ICD-10-CM | POA: Diagnosis not present

## 2019-06-17 DIAGNOSIS — Z0000 Encounter for general adult medical examination without abnormal findings: Secondary | ICD-10-CM | POA: Diagnosis not present

## 2019-06-17 DIAGNOSIS — Z8249 Family history of ischemic heart disease and other diseases of the circulatory system: Secondary | ICD-10-CM | POA: Diagnosis not present

## 2020-01-07 DIAGNOSIS — M542 Cervicalgia: Secondary | ICD-10-CM | POA: Diagnosis not present

## 2020-01-07 DIAGNOSIS — M549 Dorsalgia, unspecified: Secondary | ICD-10-CM | POA: Diagnosis not present

## 2020-01-07 DIAGNOSIS — Z9889 Other specified postprocedural states: Secondary | ICD-10-CM | POA: Diagnosis not present

## 2020-01-07 DIAGNOSIS — M5412 Radiculopathy, cervical region: Secondary | ICD-10-CM | POA: Diagnosis not present

## 2020-01-07 DIAGNOSIS — M62838 Other muscle spasm: Secondary | ICD-10-CM | POA: Diagnosis not present

## 2020-01-07 DIAGNOSIS — M5010 Cervical disc disorder with radiculopathy, unspecified cervical region: Secondary | ICD-10-CM | POA: Diagnosis not present

## 2020-01-07 DIAGNOSIS — M62830 Muscle spasm of back: Secondary | ICD-10-CM | POA: Diagnosis not present

## 2020-01-07 DIAGNOSIS — G8929 Other chronic pain: Secondary | ICD-10-CM | POA: Diagnosis not present

## 2020-01-07 DIAGNOSIS — Z981 Arthrodesis status: Secondary | ICD-10-CM | POA: Diagnosis not present

## 2020-01-15 DIAGNOSIS — Z20828 Contact with and (suspected) exposure to other viral communicable diseases: Secondary | ICD-10-CM | POA: Diagnosis not present

## 2020-01-18 DIAGNOSIS — Z981 Arthrodesis status: Secondary | ICD-10-CM | POA: Diagnosis not present

## 2020-01-18 DIAGNOSIS — M542 Cervicalgia: Secondary | ICD-10-CM | POA: Diagnosis not present

## 2020-01-18 DIAGNOSIS — M5412 Radiculopathy, cervical region: Secondary | ICD-10-CM | POA: Diagnosis not present

## 2020-01-18 DIAGNOSIS — Z9889 Other specified postprocedural states: Secondary | ICD-10-CM | POA: Diagnosis not present

## 2020-01-27 DIAGNOSIS — M542 Cervicalgia: Secondary | ICD-10-CM | POA: Diagnosis not present

## 2020-01-27 DIAGNOSIS — Z981 Arthrodesis status: Secondary | ICD-10-CM | POA: Diagnosis not present

## 2020-01-27 DIAGNOSIS — Z9889 Other specified postprocedural states: Secondary | ICD-10-CM | POA: Diagnosis not present

## 2020-01-27 DIAGNOSIS — M5412 Radiculopathy, cervical region: Secondary | ICD-10-CM | POA: Diagnosis not present

## 2020-02-04 DIAGNOSIS — Z9889 Other specified postprocedural states: Secondary | ICD-10-CM | POA: Diagnosis not present

## 2020-02-04 DIAGNOSIS — Z981 Arthrodesis status: Secondary | ICD-10-CM | POA: Diagnosis not present

## 2020-02-04 DIAGNOSIS — M542 Cervicalgia: Secondary | ICD-10-CM | POA: Diagnosis not present

## 2020-02-04 DIAGNOSIS — M5412 Radiculopathy, cervical region: Secondary | ICD-10-CM | POA: Diagnosis not present

## 2020-02-17 DIAGNOSIS — Z9889 Other specified postprocedural states: Secondary | ICD-10-CM | POA: Diagnosis not present

## 2020-02-17 DIAGNOSIS — Z981 Arthrodesis status: Secondary | ICD-10-CM | POA: Diagnosis not present

## 2020-02-17 DIAGNOSIS — M5412 Radiculopathy, cervical region: Secondary | ICD-10-CM | POA: Diagnosis not present

## 2020-02-17 DIAGNOSIS — M542 Cervicalgia: Secondary | ICD-10-CM | POA: Diagnosis not present

## 2020-02-18 DIAGNOSIS — Z981 Arthrodesis status: Secondary | ICD-10-CM | POA: Diagnosis not present

## 2020-02-18 DIAGNOSIS — M5412 Radiculopathy, cervical region: Secondary | ICD-10-CM | POA: Diagnosis not present

## 2020-02-18 DIAGNOSIS — M545 Low back pain: Secondary | ICD-10-CM | POA: Diagnosis not present

## 2020-02-18 DIAGNOSIS — M546 Pain in thoracic spine: Secondary | ICD-10-CM | POA: Diagnosis not present

## 2020-02-28 DIAGNOSIS — M549 Dorsalgia, unspecified: Secondary | ICD-10-CM | POA: Diagnosis not present

## 2020-02-28 DIAGNOSIS — M50123 Cervical disc disorder at C6-C7 level with radiculopathy: Secondary | ICD-10-CM | POA: Diagnosis not present

## 2020-02-28 DIAGNOSIS — M47814 Spondylosis without myelopathy or radiculopathy, thoracic region: Secondary | ICD-10-CM | POA: Diagnosis not present

## 2020-02-28 DIAGNOSIS — Z981 Arthrodesis status: Secondary | ICD-10-CM | POA: Diagnosis not present

## 2020-02-28 DIAGNOSIS — M542 Cervicalgia: Secondary | ICD-10-CM | POA: Diagnosis not present

## 2020-02-28 DIAGNOSIS — M5412 Radiculopathy, cervical region: Secondary | ICD-10-CM | POA: Diagnosis not present

## 2020-03-17 DIAGNOSIS — M4807 Spinal stenosis, lumbosacral region: Secondary | ICD-10-CM | POA: Diagnosis not present

## 2020-03-17 DIAGNOSIS — M48061 Spinal stenosis, lumbar region without neurogenic claudication: Secondary | ICD-10-CM | POA: Diagnosis not present

## 2020-04-10 DIAGNOSIS — M5412 Radiculopathy, cervical region: Secondary | ICD-10-CM | POA: Diagnosis not present

## 2020-04-10 DIAGNOSIS — M5416 Radiculopathy, lumbar region: Secondary | ICD-10-CM | POA: Diagnosis not present

## 2020-04-10 DIAGNOSIS — M546 Pain in thoracic spine: Secondary | ICD-10-CM | POA: Diagnosis not present

## 2020-04-10 DIAGNOSIS — M542 Cervicalgia: Secondary | ICD-10-CM | POA: Diagnosis not present

## 2020-05-08 DIAGNOSIS — M542 Cervicalgia: Secondary | ICD-10-CM | POA: Diagnosis not present

## 2020-05-08 DIAGNOSIS — M546 Pain in thoracic spine: Secondary | ICD-10-CM | POA: Diagnosis not present

## 2020-05-08 DIAGNOSIS — M5416 Radiculopathy, lumbar region: Secondary | ICD-10-CM | POA: Diagnosis not present

## 2020-05-08 DIAGNOSIS — M5412 Radiculopathy, cervical region: Secondary | ICD-10-CM | POA: Diagnosis not present

## 2020-05-17 DIAGNOSIS — M542 Cervicalgia: Secondary | ICD-10-CM | POA: Diagnosis not present

## 2020-05-17 DIAGNOSIS — M546 Pain in thoracic spine: Secondary | ICD-10-CM | POA: Diagnosis not present

## 2020-05-17 DIAGNOSIS — M5416 Radiculopathy, lumbar region: Secondary | ICD-10-CM | POA: Diagnosis not present

## 2020-05-17 DIAGNOSIS — M5412 Radiculopathy, cervical region: Secondary | ICD-10-CM | POA: Diagnosis not present

## 2020-05-22 DIAGNOSIS — M5412 Radiculopathy, cervical region: Secondary | ICD-10-CM | POA: Diagnosis not present

## 2020-05-22 DIAGNOSIS — M546 Pain in thoracic spine: Secondary | ICD-10-CM | POA: Diagnosis not present

## 2020-05-22 DIAGNOSIS — M542 Cervicalgia: Secondary | ICD-10-CM | POA: Diagnosis not present

## 2020-05-22 DIAGNOSIS — M5416 Radiculopathy, lumbar region: Secondary | ICD-10-CM | POA: Diagnosis not present

## 2020-05-29 DIAGNOSIS — M5412 Radiculopathy, cervical region: Secondary | ICD-10-CM | POA: Diagnosis not present

## 2020-05-29 DIAGNOSIS — M5416 Radiculopathy, lumbar region: Secondary | ICD-10-CM | POA: Diagnosis not present

## 2020-05-29 DIAGNOSIS — M546 Pain in thoracic spine: Secondary | ICD-10-CM | POA: Diagnosis not present

## 2020-05-29 DIAGNOSIS — M542 Cervicalgia: Secondary | ICD-10-CM | POA: Diagnosis not present

## 2020-06-05 DIAGNOSIS — M5416 Radiculopathy, lumbar region: Secondary | ICD-10-CM | POA: Diagnosis not present

## 2020-06-05 DIAGNOSIS — M5412 Radiculopathy, cervical region: Secondary | ICD-10-CM | POA: Diagnosis not present

## 2020-06-05 DIAGNOSIS — M546 Pain in thoracic spine: Secondary | ICD-10-CM | POA: Diagnosis not present

## 2020-06-05 DIAGNOSIS — M542 Cervicalgia: Secondary | ICD-10-CM | POA: Diagnosis not present

## 2021-02-20 ENCOUNTER — Other Ambulatory Visit: Payer: Self-pay | Admitting: Physician Assistant

## 2021-02-20 DIAGNOSIS — Z1231 Encounter for screening mammogram for malignant neoplasm of breast: Secondary | ICD-10-CM

## 2021-04-16 ENCOUNTER — Other Ambulatory Visit: Payer: Self-pay

## 2021-04-16 ENCOUNTER — Ambulatory Visit
Admission: RE | Admit: 2021-04-16 | Discharge: 2021-04-16 | Disposition: A | Discharge status: Home / Self Care | Payer: 59 | Source: Ambulatory Visit | Attending: Physician Assistant | Admitting: Physician Assistant

## 2021-04-16 DIAGNOSIS — Z1231 Encounter for screening mammogram for malignant neoplasm of breast: Secondary | ICD-10-CM

## 2022-09-10 IMAGING — MG DIGITAL SCREENING BREAST BILAT IMPLANT W/ TOMO W/ CAD
9 of 12 series · 9 of 28 positions shown · non-contrast
Comparison: None.

CLINICAL DATA: Screening.

EXAM:
DIGITAL SCREENING BILATERAL MAMMOGRAM WITH IMPLANTS, CAD AND
TOMOSYNTHESIS
TECHNIQUE: Bilateral screening digital craniocaudal and mediolateral oblique
mammograms were obtained. Bilateral screening digital breast
tomosynthesis was performed. The images were evaluated with
computer-aided detection. Standard and/or implant displaced views
were performed.

[L MLO]
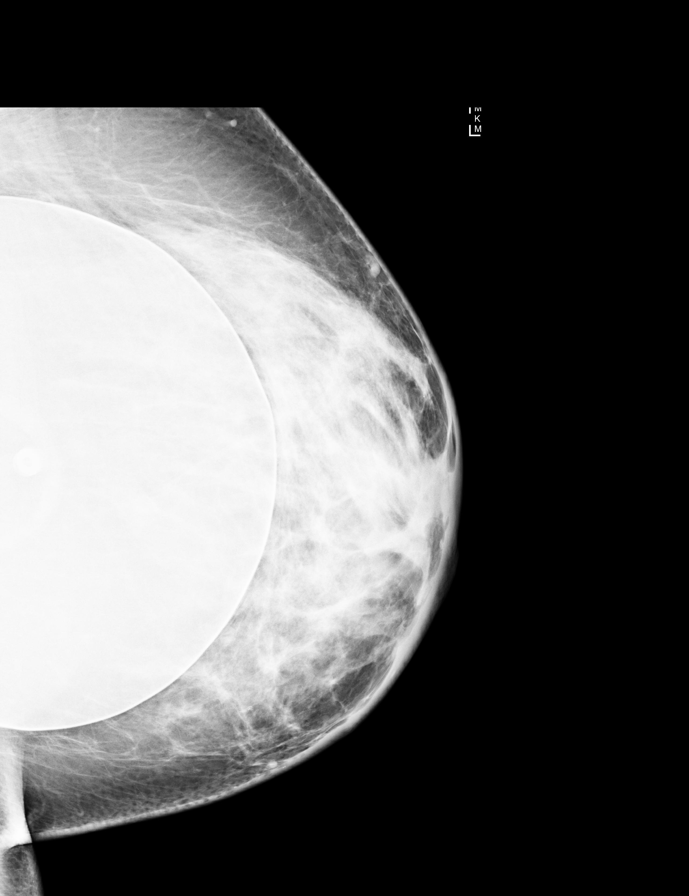

[R CC]
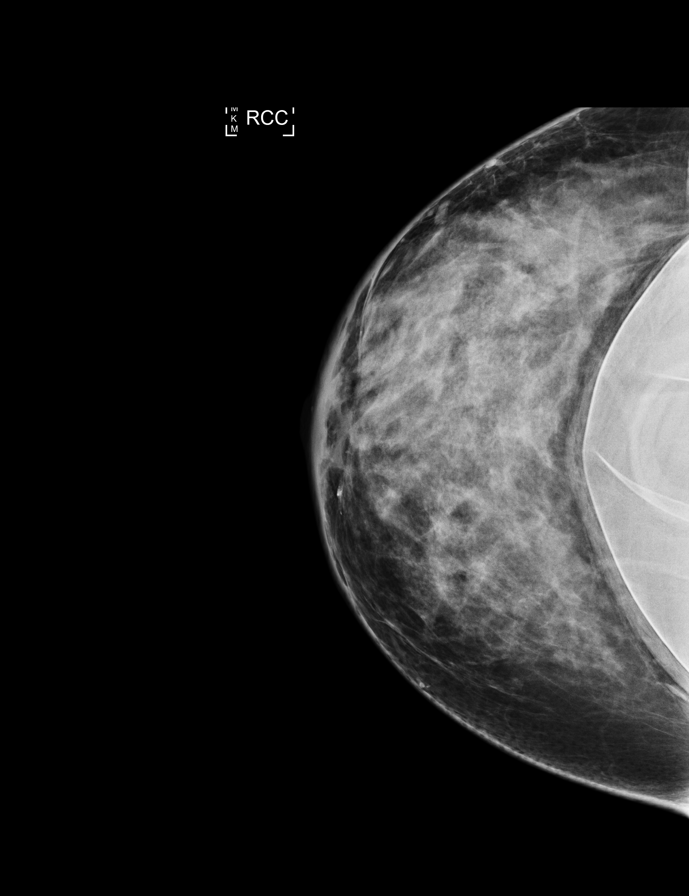

[L CC]
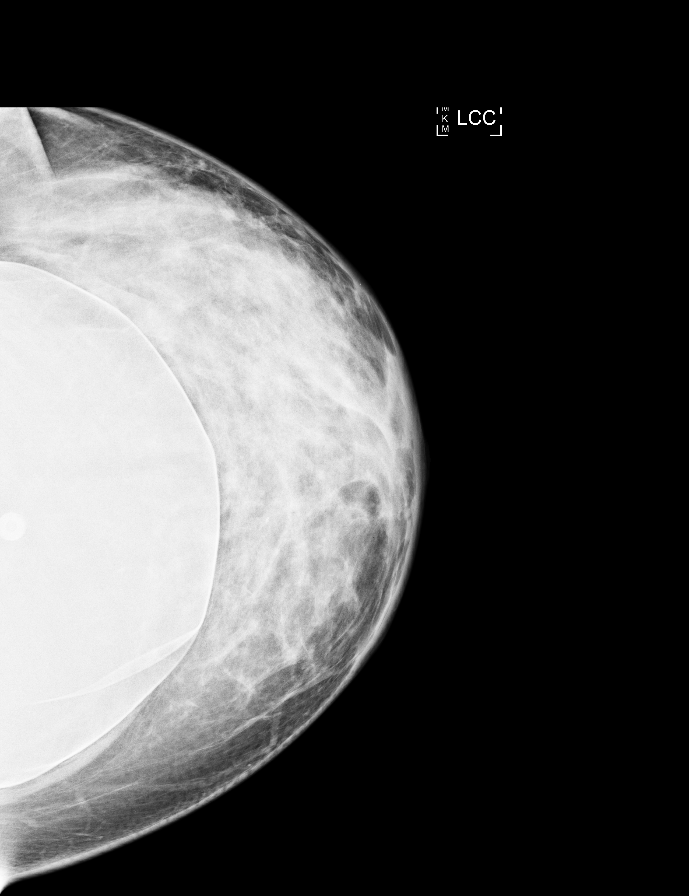

[R MLO]
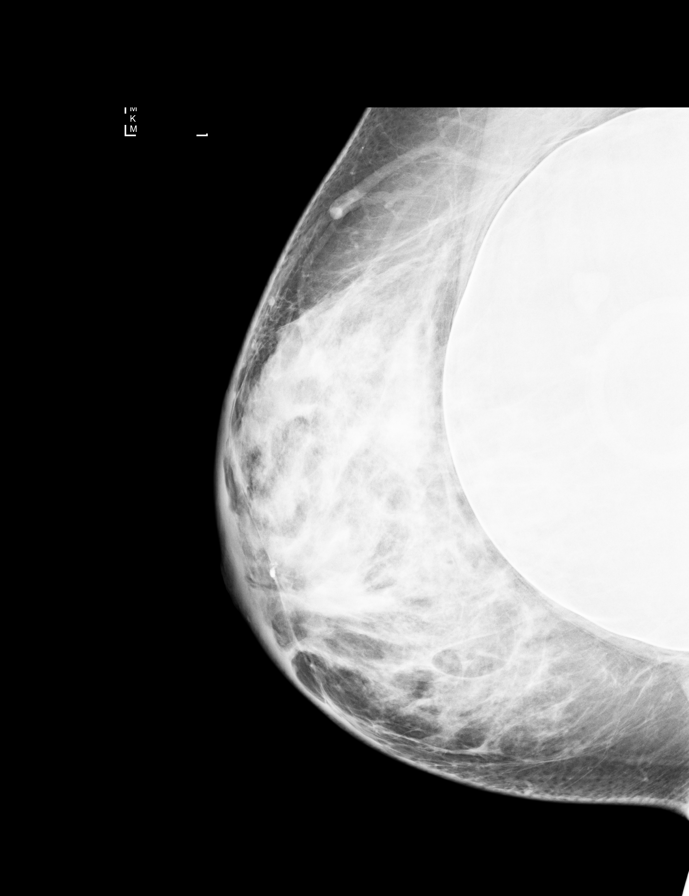

[L CC synth-2D]
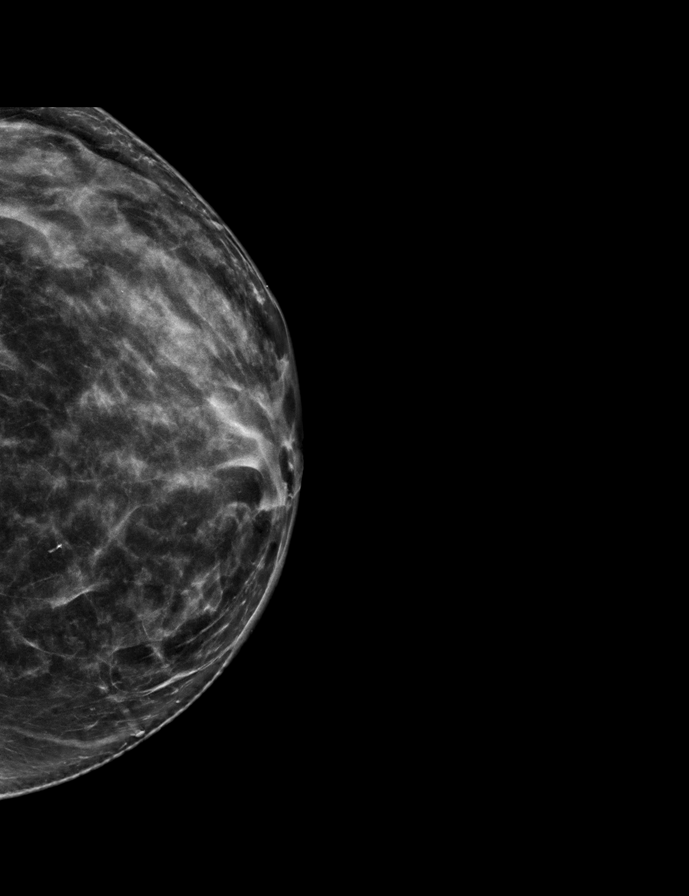

[L MLO synth-2D]
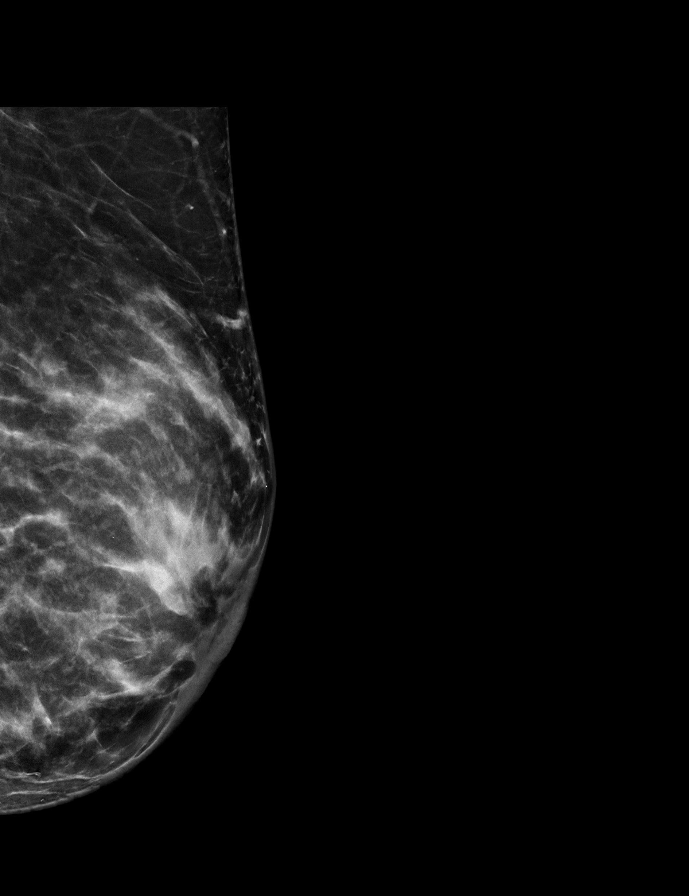

[R MLO synth-2D]
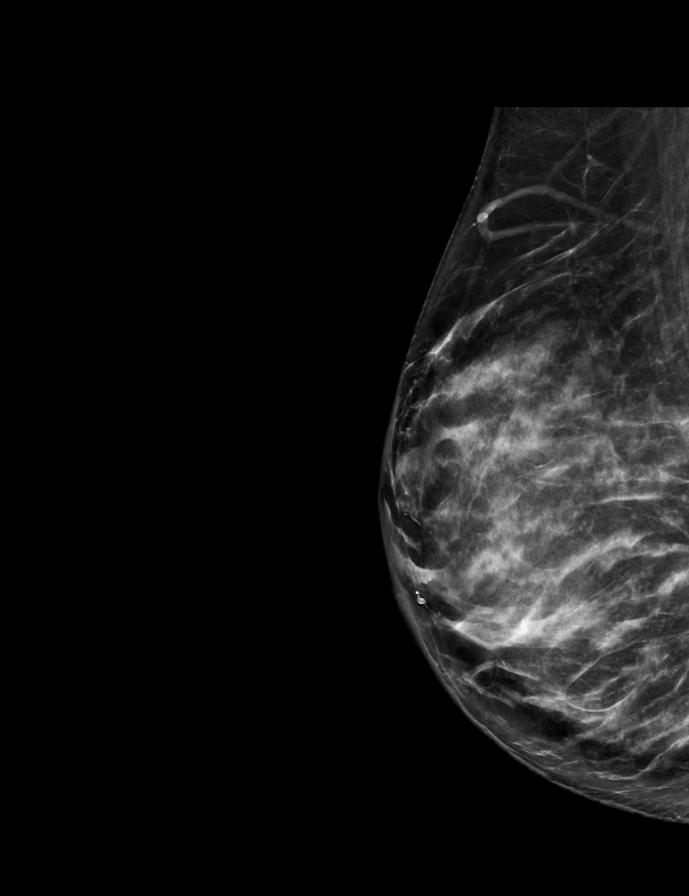

[R CC synth-2D]
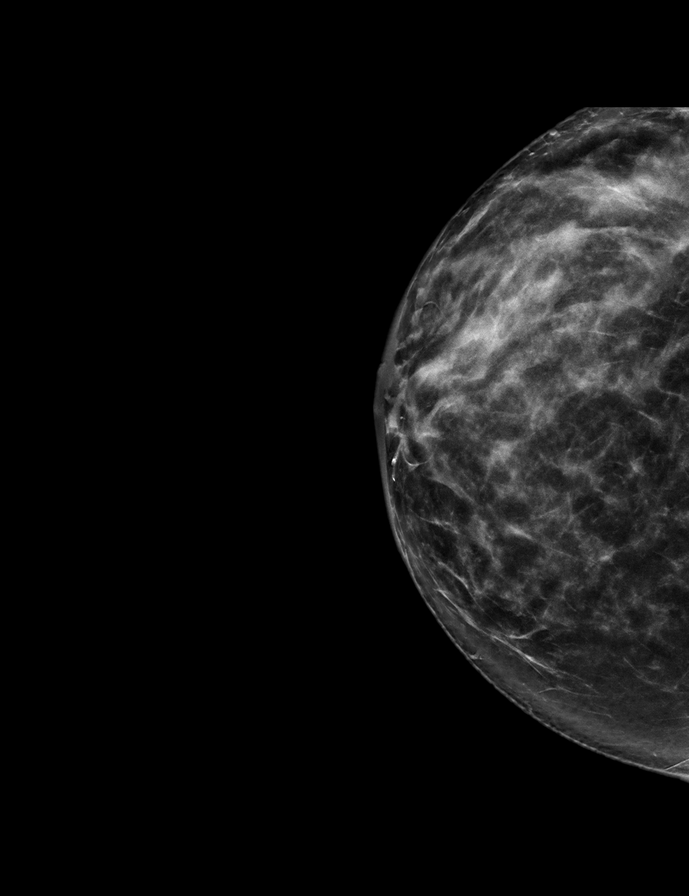

[R MLOID BREAST TOMOSYNTHESIS IMAGE tomo · tomo slice 37/74.0]
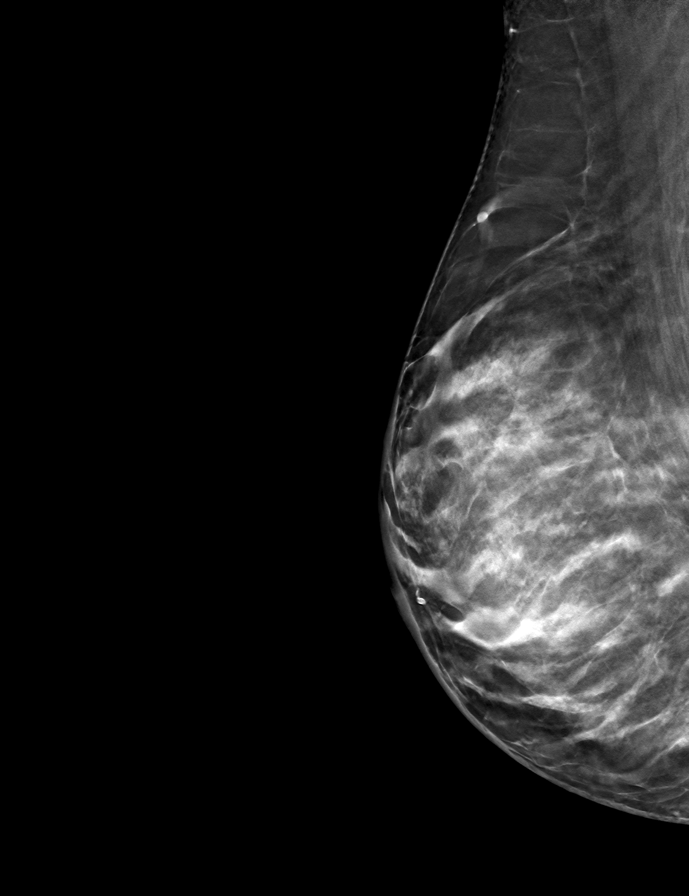

[9 of 28 positions shown; findings below may reference images not displayed]

ACR Breast Density Category c: The breast tissue is heterogeneously
dense, which may obscure small masses.
FINDINGS: The patient has retropectoral implants. There are no findings
suspicious for malignancy.
IMPRESSION: No mammographic evidence of malignancy. A result letter of this
screening mammogram will be mailed directly to the patient.

RECOMMENDATION:
Screening mammogram in one year. (Code:AY-6-W5O)

BI-RADS CATEGORY  1:  Negative.
